# Patient Record
Sex: Female | Born: 1995
Health system: Southern US, Community
[De-identification: ages and names within clinical notes are randomized; demographics above are authoritative.]

## PROBLEM LIST (undated history)

## (undated) ENCOUNTER — Emergency Department (HOSPITAL_BASED_OUTPATIENT_CLINIC_OR_DEPARTMENT_OTHER): Admission: EM | Payer: Self-pay | Source: Home / Self Care

## (undated) DIAGNOSIS — O24419 Gestational diabetes mellitus in pregnancy, unspecified control: Secondary | ICD-10-CM

## (undated) DIAGNOSIS — B379 Candidiasis, unspecified: Secondary | ICD-10-CM

## (undated) DIAGNOSIS — R87629 Unspecified abnormal cytological findings in specimens from vagina: Secondary | ICD-10-CM

## (undated) DIAGNOSIS — B009 Herpesviral infection, unspecified: Secondary | ICD-10-CM

## (undated) HISTORY — PX: WISDOM TOOTH EXTRACTION: SHX21

## (undated) HISTORY — DX: Unspecified abnormal cytological findings in specimens from vagina: R87.629

## (undated) HISTORY — DX: Gestational diabetes mellitus in pregnancy, unspecified control: O24.419

---
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
## 2015-01-23 ENCOUNTER — Ambulatory Visit (INDEPENDENT_AMBULATORY_CARE_PROVIDER_SITE_OTHER): Payer: Medicaid Other | Admitting: Family Medicine

## 2015-01-23 VITALS — BP 116/93 | HR 86 | Temp 97.8°F | Wt 150.9 lb

## 2015-01-23 DIAGNOSIS — A630 Anogenital (venereal) warts: Secondary | ICD-10-CM | POA: Diagnosis not present

## 2015-01-23 DIAGNOSIS — Z23 Encounter for immunization: Secondary | ICD-10-CM | POA: Diagnosis not present

## 2015-01-23 DIAGNOSIS — J029 Acute pharyngitis, unspecified: Secondary | ICD-10-CM | POA: Diagnosis present

## 2015-01-23 NOTE — Assessment & Plan Note (Signed)
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  No vaginal discharge or dysuria  - advised that she may follow up to try cryotherapy.  - if doesn't tolerate cryotherapy then consider Podofilox but would need a urine preg prior to use.

## 2015-01-23 NOTE — Patient Instructions (Signed)
Thank you for coming in,   You can follow up with me or Dr. Leonides Schanzorsey and we can try cryotherapy to get rid of the wart.   Sign up for My Chart to have easy access to your labs results, and communication with your Primary care physician   Please feel free to call with any questions or concerns at any time, at [phone]. --Dr. Jordan LikesSchmitz  Genital Warts Genital warts are a common STD (sexually transmitted disease). They may appear as small bumps on the tissues of the genital area or anal area. Sometimes, they can become irritated and cause pain. Genital warts are easily passed to other people through sexual contact. Getting treatment is important because genital warts can lead to other problems. In females, the virus that causes genital warts may increase the risk of cervical cancer. CAUSES Genital warts are caused by a virus that is called human papillomavirus (HPV). HPV is spread by having unprotected sex with an infected person. It can be spread through vaginal, anal, and oral sex. Many people do not know that they are infected. They may be infected for years without problems. However, even if they do not have problems, they can pass the infection to their sexual partners. RISK FACTORS Genital warts are more likely to develop in:  People who have unprotected sex.  People who have multiple sexual partners.  People who become sexually active before they are 19 years of age.  Men who are not circumcised.  Women who have a female sexual partner who is not circumcised.  People who have a weakened body defense system (immune system) due to disease or medicine.  People who smoke. SYMPTOMS Symptoms of genital warts include:  Small growths in the genital area or anal area. These warts often grow in clusters.  Itching and irritation in the genital area or anal area.  Bleeding from the warts.  Painful sexual intercourse. DIAGNOSIS Genital warts can usually be diagnosed from their appearance on  the vagina, vulva, penis, perineum, anus, or rectum. Tests may also be done, such as:  Biopsy. A tissue sample is removed so it can be looked at under a microscope.  Colposcopy. In females, a magnifying tool is used to examine the vagina and cervix. Certain solutions may be used to make the HPV cells change color so they can be seen more easily.  A Pap test in females.  Tests for other STDs. TREATMENT Treatment for genital warts may include:  Applying prescription medicines to the warts. These may be solutions or creams.  Freezing the warts with liquid nitrogen (cryotherapy).  Burning the warts with:  Laser treatment.  An electrified probe (electrocautery).  Injecting a substance (Candida antigen or Trichophyton antigen) into the warts to help the body's immune system to fight off the warts.  Interferon injections.  Surgery to remove the warts. HOME CARE INSTRUCTIONS Medicines  Apply over-the-counter and prescription medicines only as told by your health care provider.  Do not treat genital warts with medicines that are used for treating hand warts.  Talk with your health care provider about using over-the-counter anti-itch creams. General Instructions  Do not touch or scratch the warts.  Do not have sex until your treatment has been completed.  Tell your current and past sexual partners about your condition because they may also need treatment.  Keep all follow-up visits as told by your health care provider. This is important.  After treatment, use condoms during sex to prevent future infections. Other Instructions for  Women  Women who have genital warts might need increased screening for cervical cancer. This type of cancer is slow growing and can be cured if it is found early. Chances of developing cervical cancer are increased with HPV.  If you become pregnant, tell your health care provider that you have had HPV. Your health care provider will monitor you  closely during pregnancy to be sure that your baby is safe. PREVENTION Talk with your health care provider about getting the HPV vaccines. These vaccines prevent some HPV infections and cancers. It is recommended that the vaccine be given to males and females who are 42-19 years of age. It will not work if you already have HPV, and it is not recommended for pregnant women. SEEK MEDICAL CARE IF:  You have redness, swelling, or pain in the area of the treated skin.  You have a fever.  You feel generally ill.  You feel lumps in and around your genital area or anal area.  You have bleeding in your genital area or anal area.  You have pain during sexual intercourse.   This information is not intended to replace advice given to you by your health care provider. Make sure you discuss any questions you have with your health care provider.   Document Released: 02/26/2000 Document Revised: 11/19/2014 Document Reviewed: 05/26/2014 Elsevier Interactive Patient Education [employer]

## 2015-01-23 NOTE — Progress Notes (Signed)
   Subjective:    Patient ID: Julie Michael, female    DOB: 13-Apr-1995, 19 y.o.   MRN: [id number]  Seen for Same day visit for   CC: RASH  Had rash for 14 days. Location: occurring on her right vulva  Medications tried: no Similar rash in past: no New medications or antibiotics: no Tick, Insect or new pet exposure: no Recent travel: [place]  New detergent or soap: no Immunocompromised: no Sexually active and last was Oct and condom  No vaginal discharge  No Dysuria   Symptoms Itching: no Pain over rash: no Feeling ill all over: no Fever: no Mouth sores: no Face or tongue swelling: no Trouble breathing: no Joint swelling or pain: no   Review of Systems   See HPI for ROS. Objective:  BP 116/93 mmHg  Pulse 86  Temp(Src) 97.8 F (36.6 C) (Oral)  Wt 150 lb 14.4 oz (68.448 kg)  LMP 12/27/2014  General: NAD Skin: rash GU: warty like lesions occuring in peroneum near entrance to vagina.       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  No vaginal discharge or dysuria  - advised that she may follow up to try cryotherapy.  - if doesn't tolerate cryotherapy then consider Podofilox but would need a urine preg prior to use.

## 2015-02-11 ENCOUNTER — Telehealth: Payer: Self-pay | Admitting: Family Medicine

## 2015-02-11 NOTE — Telephone Encounter (Signed)
Please contact the patient concerning her upcoming appointment and clarify if she is coming in for clearance for cryotherapy or actual cryotherapy.  Thank you, Julie Puffrystal S. Zurii Hewes, MD [facility] Resident  02/11/2015, 9:31 AM

## 2015-02-11 NOTE — Telephone Encounter (Signed)
Attempted to call patient on mobile without answer, home phone number did not go through. Please let the patient know that patients normally don't need clearance for cryotherapy (I'm not sure if the other office she's going to requires clearance but our office does not require this). Additionally please let her know that if her appointment is at the other office at 3pm, she will not be able to make it given her appt in our clinic is not until 2:30.  Thanks, Joanna Puffrystal S. Dorsey, MD [facility] Resident  02/11/2015, 1:35 PM

## 2015-02-11 NOTE — Telephone Encounter (Signed)
Patient is coming for clearance, has actual cryotherapy scheduled at another office at 3pm that day.

## 2015-02-18 ENCOUNTER — Ambulatory Visit: Payer: Medicaid Other | Admitting: Family Medicine

## 2015-03-04 ENCOUNTER — Ambulatory Visit: Payer: Medicaid Other | Admitting: Family Medicine

## 2015-07-12 IMAGING — CR DG CHEST 2V
2 series · 2 of 2 positions shown · non-contrast
Comparison: None.

CLINICAL DATA: Three days of cough.

EXAM:
CHEST  2 VIEW

[view not recorded (1 of 2)]
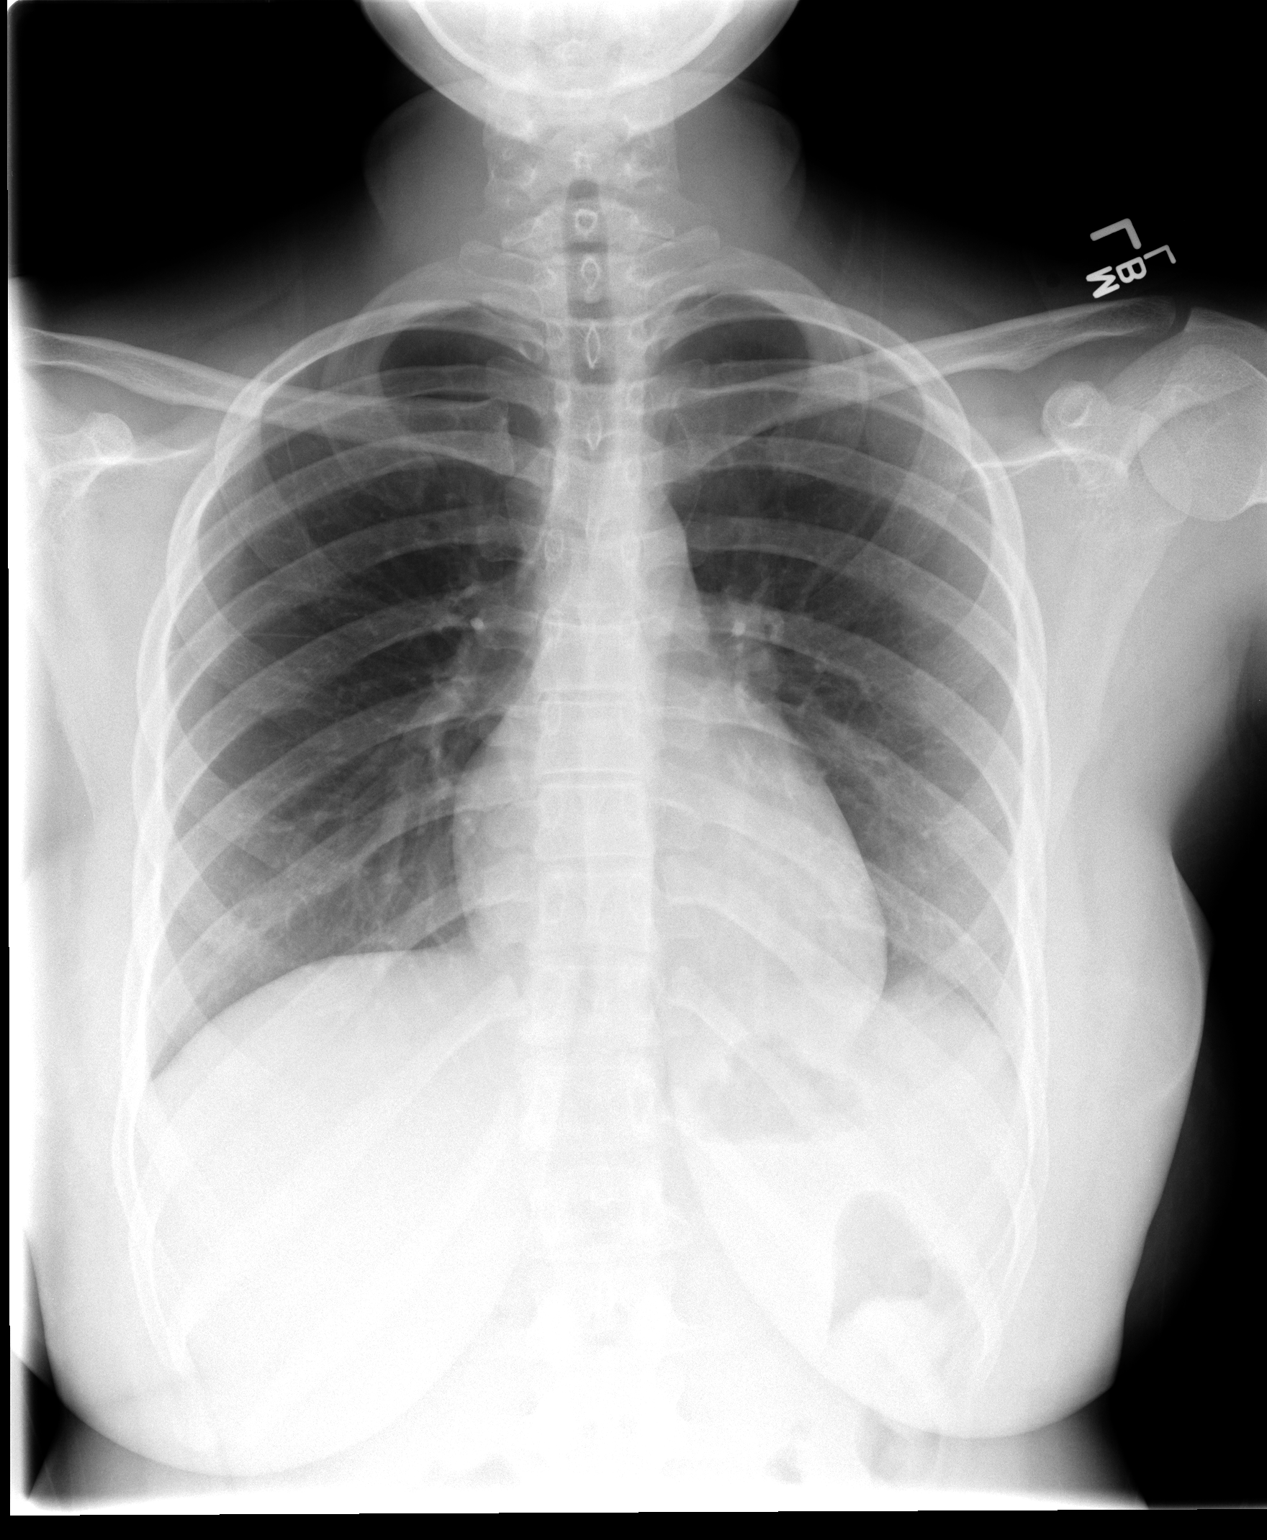

[view not recorded (2 of 2)]
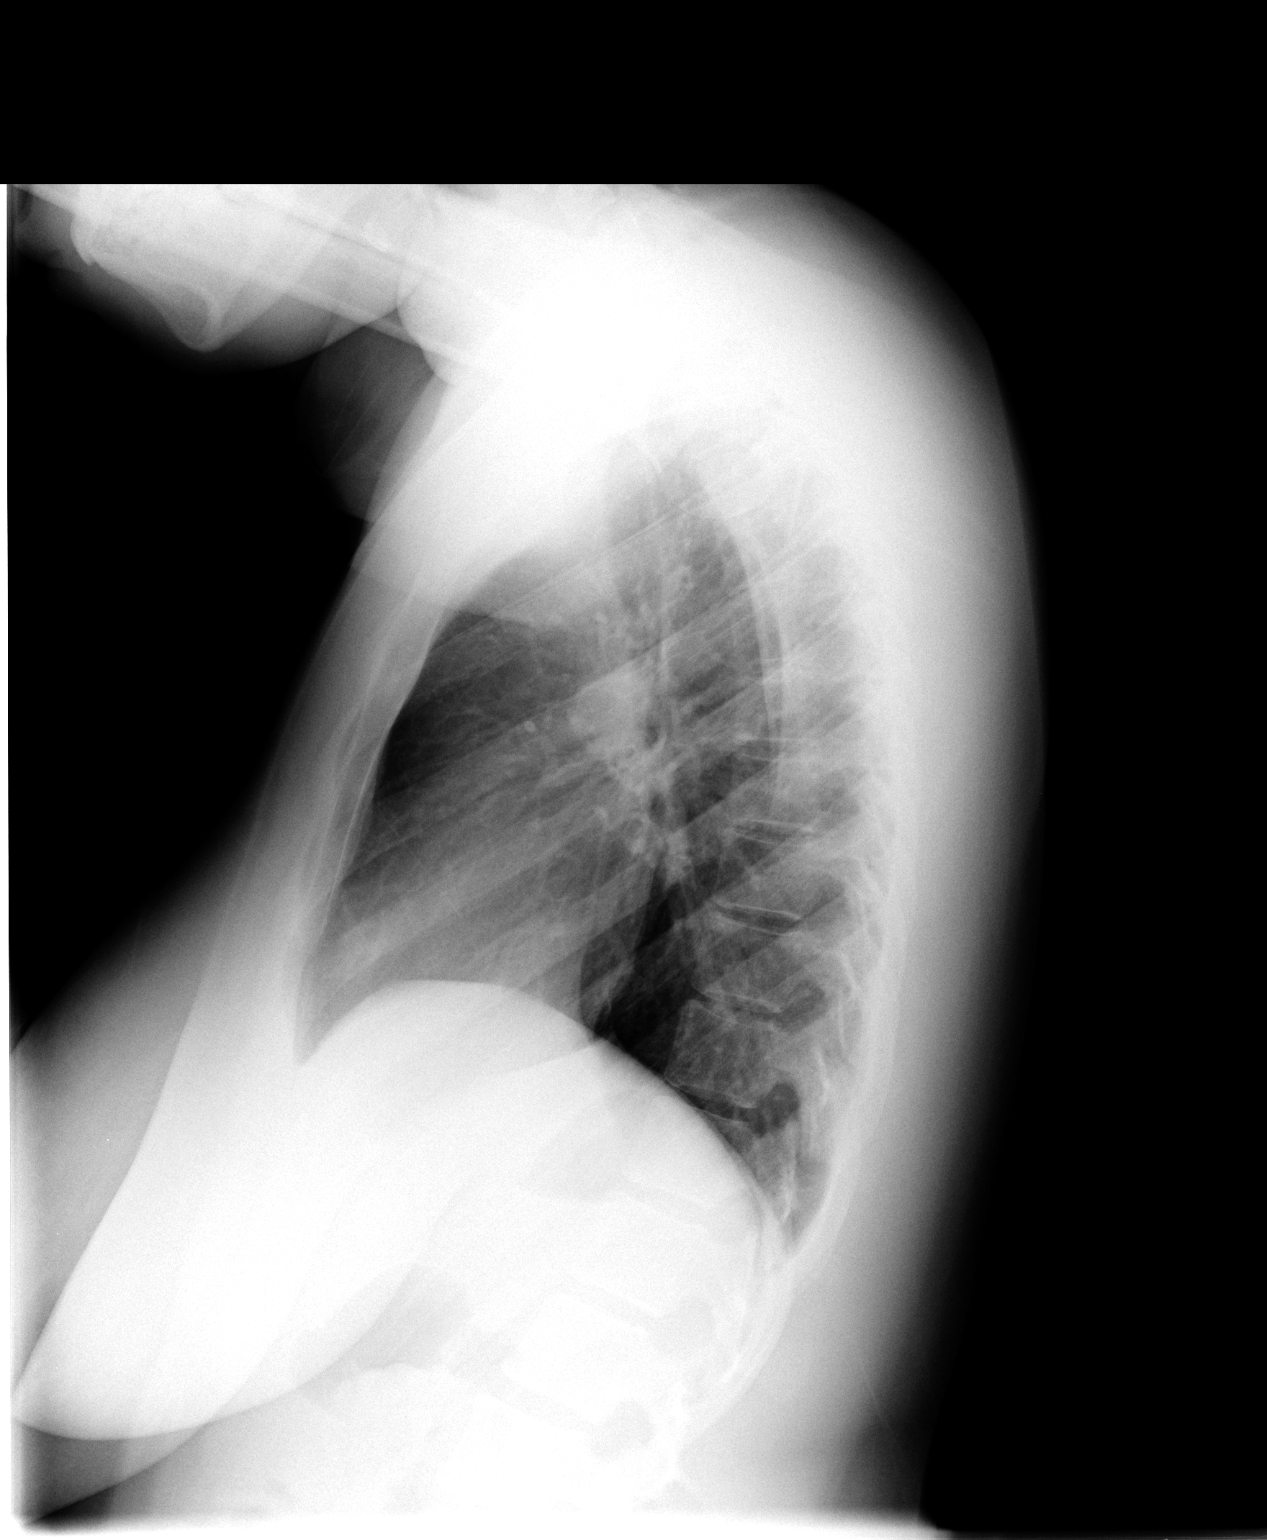

[2 of 2 positions shown; findings below may reference images not displayed]

FINDINGS: The lungs are well-expanded and clear. The cardiopericardial
silhouette is normal in size. The mediastinum is normal in width.
There is no pleural effusion or pneumothorax. The observed portions
of the bony thorax exhibit no acute abnormality.
IMPRESSION: There is no evidence of pneumonia nor other acute cardiopulmonary
abnormality.

## 2016-04-13 ENCOUNTER — Encounter: Payer: Self-pay | Admitting: Internal Medicine

## 2016-04-13 ENCOUNTER — Ambulatory Visit (INDEPENDENT_AMBULATORY_CARE_PROVIDER_SITE_OTHER): Payer: Self-pay | Admitting: Internal Medicine

## 2016-04-13 VITALS — BP 122/78 | HR 83 | Temp 98.9°F | Wt 162.0 lb

## 2016-04-13 DIAGNOSIS — N912 Amenorrhea, unspecified: Secondary | ICD-10-CM

## 2016-04-13 HISTORY — DX: Amenorrhea, unspecified: N91.2

## 2016-04-13 LAB — POCT URINE PREGNANCY: Preg Test, Ur: NEGATIVE

## 2016-04-13 NOTE — Progress Notes (Signed)
   Subjective:    Nance PearChristina Shedlock - 21 y.o. female MRN [id number]  Date of birth: 01/16/1996  HPI  Nance PearChristina Bambach is here for missed menstrual period.  Amenorrhea: First day of LMP 12/18. States she should have started her menstrual period on 1/15. She uses an app to track her period on her phone and her cycles are always regular. She does not use any type of contraception. Was previously on OCPs but discontinued them due to lack of sexual activity. Reports she is now sexually active again. Has been having unprotected sexual intercourse and states she has had intercourse on 12/18, 12/25, 1/4, 1/12 and most recently on 1/27. She does not wish to be pregnant at this time. Denies breast tenderness, nausea, and fatigue. States she feels fine. Took four urine pregnancy tests at home which were all negative.    -  reports that she has never smoked. She has never used smokeless tobacco. - Review of Systems: Per HPI. - Past Medical History: Patient Active Problem List   Diagnosis Date Noted  . Amenorrhea 04/13/2016  . Genital warts 01/23/2015  . Sinusitis, chronic 08/09/2013  . General counseling and advice on female contraception 11/29/2012  . VISUAL ACUITY, DECREASED, LEFT EYE 05/26/2009   - Medications: reviewed and updated   Objective:   Physical Exam BP 122/78   Pulse 83   Temp 98.9 F (37.2 C) (Oral)   Wt 162 lb (73.5 kg)   LMP 02/29/2016   BMI 31.64 kg/m  Gen: NAD, alert, cooperative with exam, well-appearing CV: RRR, good S1/S2, no murmur, no edema, capillary refill brisk  Resp: CTABL, no wheezes, non-labored  Assessment & Plan:   Amenorrhea Upreg in office negative. Will order quant bHCG today to rule out early pregnancy not detected by Upreg. Patient desires to start OCPs. She is to return in one week to initiate if bHCG negative. If patient has not started her menstrual period by next office visit would recommend repeat pregnancy testing. Condoms given today and  encouraged safe sex if wishing to avoid pregnancy.     Marcy Sirenatherine Exilda Wilhite, D.O. 04/13/2016, 9:11 AM PGY-2, [facility]

## 2016-04-13 NOTE — Assessment & Plan Note (Signed)
Upreg in office negative. Will order quant bHCG today to rule out early pregnancy not detected by Upreg. Patient desires to start OCPs. She is to return in one week to initiate if bHCG negative. If patient has not started her menstrual period by next office visit would recommend repeat pregnancy testing. Condoms given today and encouraged safe sex if wishing to avoid pregnancy.

## 2016-04-13 NOTE — Patient Instructions (Signed)
Your urine pregnancy test was negative. We will check a blood pregnancy test today as this can be more sensitive to an early pregnancy than urine.   I would recommend making a follow up appointment for later next week to start contraceptive measures.   Take Care,   Dr. Earlene PlaterWallace

## 2016-04-14 ENCOUNTER — Telehealth: Payer: Self-pay | Admitting: *Deleted

## 2016-04-14 LAB — HCG, QUANTITATIVE, PREGNANCY: hCG, Beta Chain, Quant, S: <2 m[IU]/mL

## 2016-04-14 NOTE — Telephone Encounter (Signed)
LVM for pt to call office back to inform her of below. Zimmerman Rumple, April D, CMA  

## 2016-04-14 NOTE — Telephone Encounter (Signed)
Pt was advised. Appointment made for 04-19-16 with Dr. Chanetta Marshallimberlake. ep

## 2016-04-14 NOTE — Telephone Encounter (Signed)
-----   Message from Arvilla Marketatherine Lauren Wallace, DO sent at 04/14/2016 10:58 AM EST ----- Please call patient and let her know blood pregnancy test was negative. She should follow up asap for contraception management.   Marcy Sirenatherine Wallace, D.O. 04/14/2016, 10:58 AM PGY-2, [facility]

## 2016-04-19 ENCOUNTER — Ambulatory Visit: Payer: Medicaid Other | Admitting: Family Medicine

## 2016-04-26 ENCOUNTER — Ambulatory Visit: Payer: Medicaid Other | Admitting: Family Medicine

## 2016-04-26 NOTE — Progress Notes (Deleted)
   Subjective:   Patient ID: Julie Michael    DOB: [birth year], 21 y.o. female   MRN: [id number]  CC: ***  HPI: Julie Michael is a 21 y.o. female who presents to clinic today ***. Problems discussed today are as follows:   Seen in office on 1/31, Upreg negative.  Quant bHCG also negative.  Patient desires to start OCPs.  Advised to repeat preg testing if has not had her period by next office visit.   Patient did not come 2/6 appointment.   Menstrual period since last time?   Currently uses condoms for contraception?  Tried norgestimate-ethinyl estradiol (Ortho-cyclen, Sprintec, Previfem) in the past ?2014.  How did you like it? How long were you on it?  Reason for stopping it.    Side effects Tolerating it well Plan to get pregnant any time soon? Periods Spotting in between? Refill or wish to change form of contraception:   ROS: See HPI for pertinent ROS.  PMFSH: Pertinent past medical, surgical, family, and social history were reviewed and updated as appropriate. Smoking status reviewed.  Medications reviewed.  Objective:   There were no vitals taken for this visit. Vitals and nursing note reviewed.  General: well nourished, well developed, in no acute distress with non-toxic appearance HEENT: normocephalic, atraumatic, moist mucous membranes Neck: supple, non-tender without lymphadenopathy CV: regular rate and rhythm without murmurs rubs or gallops Lungs: clear to auscultation bilaterally with normal work of breathing Abdomen: soft, non-tender, no masses or organomegaly palpable, normoactive bowel sounds Skin: warm, dry, no rashes or lesions, cap refill < 2 seconds Extremities: warm and well perfused, normal tone  Assessment & Plan:   No problem-specific Assessment & Plan notes found for this encounter.  No orders of the defined types were placed in this encounter.  No orders of the defined types were placed in this encounter.   Freddrick MarchYashika Carman Auxier, MD [facility], PGY-1 04/26/2016 8:39 AM

## 2016-09-08 ENCOUNTER — Other Ambulatory Visit (HOSPITAL_COMMUNITY)
Admission: RE | Admit: 2016-09-08 | Discharge: 2016-09-08 | Disposition: A | Discharge status: Home / Self Care | Payer: Medicaid Other | Source: Ambulatory Visit | Attending: Family Medicine | Admitting: Family Medicine

## 2016-09-08 ENCOUNTER — Encounter: Payer: Self-pay | Admitting: Student

## 2016-09-08 ENCOUNTER — Ambulatory Visit (INDEPENDENT_AMBULATORY_CARE_PROVIDER_SITE_OTHER): Payer: Self-pay | Admitting: Student

## 2016-09-08 VITALS — BP 120/62 | HR 93 | Temp 98.3°F | Wt 154.0 lb

## 2016-09-08 DIAGNOSIS — Z124 Encounter for screening for malignant neoplasm of cervix: Secondary | ICD-10-CM | POA: Insufficient documentation

## 2016-09-08 DIAGNOSIS — Z113 Encounter for screening for infections with a predominantly sexual mode of transmission: Secondary | ICD-10-CM | POA: Insufficient documentation

## 2016-09-08 DIAGNOSIS — Z0000 Encounter for general adult medical examination without abnormal findings: Secondary | ICD-10-CM

## 2016-09-08 DIAGNOSIS — Z30019 Encounter for initial prescription of contraceptives, unspecified: Secondary | ICD-10-CM

## 2016-09-08 DIAGNOSIS — IMO0001 Reserved for inherently not codable concepts without codable children: Secondary | ICD-10-CM | POA: Insufficient documentation

## 2016-09-08 DIAGNOSIS — B379 Candidiasis, unspecified: Secondary | ICD-10-CM | POA: Insufficient documentation

## 2016-09-08 DIAGNOSIS — N898 Other specified noninflammatory disorders of vagina: Secondary | ICD-10-CM

## 2016-09-08 HISTORY — DX: Encounter for screening for infections with a predominantly sexual mode of transmission: Z11.3

## 2016-09-08 LAB — POCT WET PREP (WET MOUNT)
CLUE CELLS WET PREP WHIFF POC: NEGATIVE
Trichomonas Wet Prep HPF POC: ABSENT

## 2016-09-08 LAB — POCT URINE PREGNANCY: Preg Test, Ur: NEGATIVE

## 2016-09-08 MED ORDER — FLUCONAZOLE 150 MG PO TABS: 150.0000 mg | ORAL_TABLET | Freq: Once | ORAL | 0 refills | Status: AC

## 2016-09-08 MED ORDER — NORGESTIMATE-ETH ESTRADIOL 0.25-35 MG-MCG PO TABS: 1.0000 | ORAL_TABLET | Freq: Every day | ORAL | 11 refills | Status: DC

## 2016-09-08 NOTE — Assessment & Plan Note (Signed)
>>  ASSESSMENT AND PLAN FOR DISCHARGE FROM THE VAGINA WRITTEN ON 09/08/2016  2:33 PM BY HANEY, ALYSSA A, MD  Vaginal discharge with otherwise benign exam - wet prep shows yeast, will start diflucan - GC/Ct collected today

## 2016-09-08 NOTE — Assessment & Plan Note (Signed)
Pap smear today. 

## 2016-09-08 NOTE — Assessment & Plan Note (Signed)
Contraception counseling given - Upreg negative, will start OCPs now - she is considering LARC methods, information hand out given

## 2016-09-08 NOTE — Patient Instructions (Addendum)
Follow up with PCP in one month for contraception follow up You will be called for any abnormal results Call the office with questions or concerns

## 2016-09-08 NOTE — Assessment & Plan Note (Addendum)
Vaginal discharge with otherwise benign exam - wet prep shows yeast, will start diflucan - GC/Ct collected today

## 2016-09-08 NOTE — Progress Notes (Signed)
   Subjective:    Patient ID: Julie Michael, female    DOB: 10/13/1995, 21 y.o.   MRN: [id number]   CC: vaginal discharge  HPI: 21 y/o F presents for vaginal discharge  Vaginal discharge - noted 2 days ago - some itching when she wipes and some irritation - no odor, abdominal pain or dysuria - she is sexually active with one female partner - she does not use condoms or contraception - Patient's last menstrual period was 08/17/2016. - denies history of STD or pregnancy  Smoking status reviewed . Review of Systems  Per HPI, else denies recent illness, fever,  chest pain, shortness of breath,     Objective:  BP 120/62   Pulse 93   Temp 98.3 F (36.8 C) (Oral)   Wt 154 lb (69.9 kg)   LMP 08/17/2016   SpO2 98%   BMI 30.08 kg/m  Vitals and nursing note reviewed  General: NAD Cardiac: RRR,  Respiratory: CTAB, normal effort Skin: warm and dry, no rashes noted Neuro: alert and oriented, no focal deficits Pelvic: Normal EGBUS, normal vaginal canal but with copious white thick clumpy discharge, cervix mildly friable but else normal with no CMT, normal mobile uterus, normal adnexa with no masses, no adnexal tenderness     Assessment & Plan:    Contraception Contraception counseling given - Upreg negative, will start OCPs now - she is considering LARC methods, information hand out given  Vaginal discharge Vaginal discharge with otherwise benign exam - wet prep shows yeast, will start diflucan - GC/Ct collected today  Healthcare maintenance Pap smear today    Charrisse Masley A. Kennon RoundsHaney MD, MS Family Medicine Resident PGY-3 Pager [phone]

## 2016-09-09 LAB — CERVICOVAGINAL ANCILLARY ONLY
Chlamydia: NEGATIVE
NEISSERIA GONORRHEA: NEGATIVE

## 2016-09-12 LAB — CYTOLOGY - PAP: DIAGNOSIS: NEGATIVE

## 2016-10-17 ENCOUNTER — Other Ambulatory Visit (HOSPITAL_COMMUNITY)
Admission: RE | Admit: 2016-10-17 | Discharge: 2016-10-17 | Disposition: A | Discharge status: Home / Self Care | Payer: BLUE CROSS/BLUE SHIELD | Source: Ambulatory Visit | Attending: Family Medicine | Admitting: Family Medicine

## 2016-10-17 ENCOUNTER — Ambulatory Visit (INDEPENDENT_AMBULATORY_CARE_PROVIDER_SITE_OTHER): Payer: BLUE CROSS/BLUE SHIELD | Admitting: Internal Medicine

## 2016-10-17 ENCOUNTER — Telehealth: Payer: Self-pay | Admitting: *Deleted

## 2016-10-17 ENCOUNTER — Encounter: Payer: Self-pay | Admitting: Internal Medicine

## 2016-10-17 VITALS — BP 100/70 | HR 75 | Temp 99.0°F | Wt 156.0 lb

## 2016-10-17 DIAGNOSIS — Z7251 High risk heterosexual behavior: Secondary | ICD-10-CM | POA: Diagnosis not present

## 2016-10-17 DIAGNOSIS — N898 Other specified noninflammatory disorders of vagina: Secondary | ICD-10-CM

## 2016-10-17 LAB — POCT WET PREP (WET MOUNT)
CLUE CELLS WET PREP WHIFF POC: NEGATIVE
Trichomonas Wet Prep HPF POC: ABSENT

## 2016-10-17 LAB — POCT URINE PREGNANCY: Preg Test, Ur: NEGATIVE

## 2016-10-17 NOTE — Progress Notes (Signed)
   [facility] Phone: [phone]  Subjective:  Julie Michael is a 21 year old female presenting to clinic with vaginal discharge for the last 2 weeks. The discharge has been clear and white. She thought she had a yeast infection, so she bought some over-the-counter Monistat which she used on Thursday. No vaginal itching or irritation. She has had some vaginal bleeding starting yesterday. She has not started her period until tomorrow. She states unusual for her to start her period a few days early. She notes some mild abdominal cramping, which is typical for her when she has been shaving. She has a new sexual partner since June. She got a prescription for OCPs recently, but has not started taking them yet. She used a condom with her new sexual partner.  ROS: See HPI for pertinent positives and negatives  Past Medical History- chronic sinusitis  Family history reviewed for today's visit. No changes.  Social history- patient is a never smoker  Objective: BP 100/70   Pulse 75   Temp 99 F (37.2 C) (Oral)   Wt 156 lb (70.8 kg)   LMP 09/18/2016   SpO2 98%   BMI 30.47 kg/m  Gen: NAD, alert, cooperative with exam GI: Soft, nontender, nondistended GU: External genitalia normal in appearance, no vaginal lesions, vaginal walls normal, cervix normal in appearance, no cervical motion tenderness, small amount of white discharge present with minimal amount of brown blood.  Assessment/Plan: Vaginal Discharge: Think this is unlikely BV or yeast infection, given her lack of itchiness or irritation. Patient is only having a small amount of white discharge, likely physiologic. New sexual partner, so will need to rule out sexually transmitted diseases. - Wet prep - HIV, RPR, gonorrhea/chlamydia - Urine pregnancy test - Will be starting OCPs this coming Sunday, already has the prescription at home - Will call patient with lab results.   Willadean CarolKaty Sharlize Hoar, MD PGY-3

## 2016-10-17 NOTE — Telephone Encounter (Signed)
-----   Message from Campbell StallKaty Dodd Mayo, MD sent at 10/17/2016  4:00 PM EDT ----- Please let [name] know that she does not have yeast or bacterial vaginosis. Her pregnancy test was negative. We will call her when her STD testing comes back in the next few days. Thanks!

## 2016-10-17 NOTE — Patient Instructions (Signed)
It was so nice to meet you!  I will call you later today with your lab results.  -Dr. Nancy MarusMayo

## 2016-10-17 NOTE — Telephone Encounter (Signed)
Spoke with patient and she is aware of results. Jibran Crookshanks,CMA  

## 2016-10-17 NOTE — Assessment & Plan Note (Signed)
Think this is unlikely BV or yeast infection, given her lack of itchiness or irritation. Patient is only having a small amount of white discharge, likely physiologic. New sexual partner, so will need to rule out sexually transmitted diseases. - Wet prep - HIV, RPR, gonorrhea/chlamydia - Urine pregnancy test - Will be starting OCPs this coming Sunday, already has the prescription at home - Will call patient with lab results.

## 2016-10-17 NOTE — Assessment & Plan Note (Signed)
>>  ASSESSMENT AND PLAN FOR DISCHARGE FROM THE VAGINA WRITTEN ON 10/17/2016 12:16 PM BY MAYO, KATY DODD, MD  Think this is unlikely BV or yeast infection, given her lack of itchiness or irritation. Patient is only having a small amount of white discharge, likely physiologic. New sexual partner, so will need to rule out sexually transmitted diseases. - Wet prep - HIV, RPR, gonorrhea/chlamydia - Urine pregnancy test - Will be starting OCPs this coming Sunday, already has the prescription at home - Will call patient with lab results.

## 2016-10-18 LAB — CERVICOVAGINAL ANCILLARY ONLY
CHLAMYDIA, DNA PROBE: NEGATIVE
NEISSERIA GONORRHEA: NEGATIVE

## 2016-10-19 ENCOUNTER — Encounter: Payer: Self-pay | Admitting: Student

## 2016-10-25 ENCOUNTER — Telehealth: Payer: Self-pay | Admitting: *Deleted

## 2016-10-25 NOTE — Telephone Encounter (Signed)
Patient is aware of negative STD results. Jazmin Hartsell,CMA

## 2016-10-25 NOTE — Progress Notes (Signed)
LM for patient to call the office back. Jazmin Hartsell,CMA

## 2016-11-30 ENCOUNTER — Other Ambulatory Visit (HOSPITAL_COMMUNITY)
Admission: RE | Admit: 2016-11-30 | Discharge: 2016-11-30 | Disposition: A | Discharge status: Home / Self Care | Payer: Medicaid Other | Source: Ambulatory Visit | Attending: Family Medicine | Admitting: Family Medicine

## 2016-11-30 ENCOUNTER — Ambulatory Visit (INDEPENDENT_AMBULATORY_CARE_PROVIDER_SITE_OTHER): Payer: Medicaid Other | Admitting: Student in an Organized Health Care Education/Training Program

## 2016-11-30 VITALS — BP 110/70 | HR 73 | Temp 98.6°F | Ht 60.0 in | Wt 156.8 lb

## 2016-11-30 DIAGNOSIS — N898 Other specified noninflammatory disorders of vagina: Secondary | ICD-10-CM | POA: Insufficient documentation

## 2016-11-30 DIAGNOSIS — Z113 Encounter for screening for infections with a predominantly sexual mode of transmission: Secondary | ICD-10-CM

## 2016-11-30 LAB — POCT WET PREP (WET MOUNT)
CLUE CELLS WET PREP WHIFF POC: NEGATIVE
Trichomonas Wet Prep HPF POC: ABSENT

## 2016-11-30 MED ORDER — MICONAZOLE NITRATE 2 % VA CREA: 1.0000 | TOPICAL_CREAM | Freq: Every day | VAGINAL | 0 refills | Status: DC

## 2016-11-30 NOTE — Patient Instructions (Signed)
It was a pleasure seeing you today in our clinic. Today we discussed your vaginal discharge. Here is the treatment plan we have discussed and agreed upon together: - We drew blood work at today's visit. I will call or send you a letter with these results. If you do not hear from me within the next week, please give our office a call. - I will call you with the results of your pregnancy test - in the meantime, the 7-day course of vaginal treatment for yeast infection is called into your pharmacy.  Our clinic's number is [phone]. Please call with questions or concerns about what we discussed today.  Be well, Dr. Mosetta Putt

## 2016-11-30 NOTE — Progress Notes (Signed)
   CC: vaginal itching  HPI: Julie Michael is a 21 y.o. female with PMH significant for STI who presents to [facility] today with vaginal itching of 3 days duration.   VAGINAL DISCHARGE  Onset: 3 days ago   Description: not clumpy, clear/white  Odor: no odor   Itching: yes  Symptoms Dysuria: no  Bleeding: no  Pelvic pain: no  Back pain: no  Fever: no  Genital sores: no  Rash: no  Dyspareunia: no  GI Sxs: no  Prior treatment: yes, reports yeast infection 2-3 months ago   Red Flags: Missed period: no; last period 9/5 Pregnancy: no  Recent antibiotics: no  Sexual activity: no  Possible STD exposure: yes  IUD: no  Diabetes: no   Has not started pill yet.    Review of Symptoms:  See HPI for ROS.   CC, SH/smoking status, and VS noted.  Objective: BP 110/70 (BP Location: Left Arm, Patient Position: Sitting, Cuff Size: Normal)   Pulse 73   Temp 98.6 F (37 C) (Oral)   Ht 5' (1.524 m)   Wt 156 lb 12.8 oz (71.1 kg)   LMP 11/16/2016   SpO2 99%   BMI 30.62 kg/m  GEN: NAD, alert, cooperative, and pleasant. Female genitalia: not done normal external genitalia, vulva, vagina, cervix, uterus and adnexa Cervix: cervical discharge present - creamy and curd-like   Assessment and plan:  Routine screening for STI (sexually transmitted infection) GC/Chlamydia, HIV, wet prep completed today.  Vaginal discharge "Few yeast" on wet prep, but discharge looks like candida and patient endorses her symptoms are the same as previous yeast infections. Patient is 2 weeks into her cycle and has had unprotected intercourse, so cannot give diflucan without testing for pregnancy and it is too early for a urine pregnancy test to be reliable.  - bHcg quant - if bHcg negative, can give diflucan - patient doesn't want to wait for bHcg results tomorrow to start treatment so she'll start monostat 7 day course this evening. Can stop and do diflucan once pregnancy test is negative.   Orders  Placed This Encounter  Procedures  . HIV antibody (with reflex)  . hCG, serum, qualitative  . POCT Wet Prep [facility])    Meds ordered this encounter  Medications  . miconazole (EQ MICONAZOLE 7 DAY TREATMENT) 2 % vaginal cream    Sig: Place 1 Applicatorful vaginally at bedtime.    Dispense:  45 g    Refill:  0     Howard Pouch, MD,MS,  PGY2 12/01/2016 3:54 PM

## 2016-12-01 ENCOUNTER — Other Ambulatory Visit: Payer: Self-pay | Admitting: Student in an Organized Health Care Education/Training Program

## 2016-12-01 LAB — CERVICOVAGINAL ANCILLARY ONLY
CHLAMYDIA, DNA PROBE: NEGATIVE
NEISSERIA GONORRHEA: NEGATIVE

## 2016-12-01 LAB — HCG, SERUM, QUALITATIVE: hCG,Beta Subunit,Qual,Serum: NEGATIVE m[IU]/mL (ref <6)

## 2016-12-01 LAB — HIV ANTIBODY (ROUTINE TESTING W REFLEX): HIV SCREEN 4TH GENERATION: NONREACTIVE

## 2016-12-01 MED ORDER — FLUCONAZOLE 150 MG PO TABS: 150.0000 mg | ORAL_TABLET | Freq: Every day | ORAL | 0 refills | Status: AC

## 2016-12-01 NOTE — Progress Notes (Signed)
Pregnancy test negative. Called patient to give her these results and she asks for PO management of yeast infection. Diflucan 150 mg x1 sent to pharmacy.

## 2016-12-01 NOTE — Assessment & Plan Note (Signed)
GC/Chlamydia, HIV, wet prep completed today.

## 2016-12-01 NOTE — Assessment & Plan Note (Signed)
"  Few yeast" on wet prep, but discharge looks like candida and patient endorses her symptoms are the same as previous yeast infections. Patient is 2 weeks into her cycle and has had unprotected intercourse, so cannot give diflucan without testing for pregnancy and it is too early for a urine pregnancy test to be reliable.  - bHcg quant - if bHcg negative, can give diflucan - patient doesn't want to wait for bHcg results tomorrow to start treatment so she'll start monostat 7 day course this evening. Can stop and do diflucan once pregnancy test is negative.

## 2016-12-01 NOTE — Assessment & Plan Note (Signed)
>>  ASSESSMENT AND PLAN FOR DISCHARGE FROM THE VAGINA WRITTEN ON 12/01/2016  3:54 PM BY Howard Pouch, MD  "Few yeast" on wet prep, but discharge looks like candida and patient endorses her symptoms are the same as previous yeast infections. Patient is 2 weeks into her cycle and has had unprotected intercourse, so cannot give diflucan without testing for pregnancy and it is too early for a urine pregnancy test to be reliable.  - bHcg quant - if bHcg negative, can give diflucan - patient doesn't want to wait for bHcg results tomorrow to start treatment so she'll start monostat 7 day course this evening. Can stop and do diflucan once pregnancy test is negative.

## 2016-12-05 ENCOUNTER — Encounter: Payer: Self-pay | Admitting: Student in an Organized Health Care Education/Training Program

## 2016-12-16 ENCOUNTER — Ambulatory Visit (INDEPENDENT_AMBULATORY_CARE_PROVIDER_SITE_OTHER): Payer: Self-pay | Admitting: Family Medicine

## 2016-12-16 ENCOUNTER — Encounter: Payer: Self-pay | Admitting: Family Medicine

## 2016-12-16 VITALS — BP 100/60 | HR 80 | Temp 98.3°F | Ht 60.0 in | Wt 155.0 lb

## 2016-12-16 DIAGNOSIS — M7989 Other specified soft tissue disorders: Secondary | ICD-10-CM

## 2016-12-16 DIAGNOSIS — M79674 Pain in right toe(s): Secondary | ICD-10-CM

## 2016-12-16 MED ORDER — NAPROXEN 500 MG PO TABS: 500.0000 mg | ORAL_TABLET | Freq: Two times a day (BID) | ORAL | 0 refills | Status: AC

## 2016-12-16 NOTE — Patient Instructions (Signed)
It was nice seeing you today. You might be having pain from stress fracture vs overuse. Please obtain foot brace from the pharmacy to keep weight off it. Use Naproxen as needed for pain.

## 2016-12-16 NOTE — Progress Notes (Signed)
Subjective:     Patient ID: Julie Michael, female   DOB: 1995-07-28, 21 y.o.   MRN: [id number]  HPI Foot pain: C/O right foot pain x 1 week. Denies any injury but feels swollen and lax whenever she works on it. At work she had to stand on her feet 6-8 hours 6 days per week.She had been with her job for 3 months and pain started few weeks after she started work. Pain is about 7/10 in severity. Yesterday her foot was so swollen and the pain was about 9/10 in severity. She took ibuprofen as needed for pain with some improvement. Today the swollen is down.  Current Outpatient Prescriptions on File Prior to Visit  Medication Sig Dispense Refill  . ibuprofen (ADVIL,MOTRIN) 800 MG tablet Take 1 tablet (800 mg total) by mouth 3 (three) times daily. (Patient not taking: Reported on 09/16/2014) 21 tablet 0  . metroNIDAZOLE (FLAGYL) 500 MG tablet Take 1 tablet (500 mg total) by mouth 2 (two) times daily. (Patient not taking: Reported on 12/16/2016) 14 tablet 0  . miconazole (EQ MICONAZOLE 7 DAY TREATMENT) 2 % vaginal cream Place 1 Applicatorful vaginally at bedtime. (Patient not taking: Reported on 12/16/2016) 45 g 0  . norgestimate-ethinyl estradiol (ORTHO-CYCLEN,SPRINTEC,PREVIFEM) 0.25-35 MG-MCG tablet Take 1 tablet by mouth daily. (Patient not taking: Reported on 12/16/2016) 1 Package 11  . Pseudoeph-Doxylamine-DM-APAP (NYQUIL MULTI-SYMPTOM PO) Take 30 mLs by mouth daily as needed (for cold).    . SALINE NASAL MIST NA Place 2 sprays into the nose daily as needed (for congestion).    . sucralfate (CARAFATE) 1 GM/10ML suspension Take 5 mLs (0.5 g total) by mouth 3 (three) times daily as needed (mouth pain). (Patient not taking: Reported on 09/16/2014) 420 mL 0   Current Facility-Administered Medications on File Prior to Visit  Medication Dose Route Frequency Provider Last Rate Last Dose  . meningococcal polysaccharide (MENACTRA) injection 0.5 mL  0.5 mL Intramuscular Once Rodolph Bong, MD       History  reviewed. No pertinent past medical history. Vitals:   12/16/16 0922  BP: 100/60  Pulse: 80  Temp: 98.3 F (36.8 C)  TempSrc: Oral  SpO2: 99%  Weight: 155 lb (70.3 kg)  Height: 5' (1.524 m)     Review of Systems  Respiratory: Negative.   Cardiovascular: Negative.   Musculoskeletal:       Right foot pain and swelling.  All other systems reviewed and are negative.      Objective:   Physical Exam  Constitutional: She appears well-developed. No distress.  Cardiovascular: Normal rate, regular rhythm and normal heart sounds.   No murmur heard. Pulmonary/Chest: Effort normal and breath sounds normal. No respiratory distress. She has no wheezes.  Musculoskeletal:       Right ankle: She exhibits normal range of motion and no swelling.       Left ankle: Normal.       Right foot: There is bony tenderness. There is normal range of motion, no swelling, normal capillary refill, no deformity and no laceration.       Left foot: Normal.       Feet:  Nursing note and vitals reviewed.      Assessment:     Right foot pain and swelling    Plan:     Likely due to overuse vs stress fracture. I recommended intermittent elevation and reduced weight bearing on her right foot. I recommended referral to sport meds today to apply foot boot but she  stated she will get it OTC. Ace bandage applied. Naproxen prescribed prn pain. F/U soon if no improvement or symptoms worsens. She agreed with plan.

## 2017-07-11 NOTE — Progress Notes (Signed)
   [facility] Phone: [phone]   Date of Visit: 07/12/2017   HPI:  Vaginal Irritation:  - 2 week history of burning with wiping. Resolved but then reappeared this past Sunday  - she is having some thin white discharge but unsure if this is her physiologic discharge - no dysuria, no urinary frequnecy, hematuria, no CVA pain, no fevers, no abdominal pain  - sexually active but does not use condoms. Discussed - currently not on birth control. Hesitant about some of the options. She was on OCPs but does not remember to take them daily   - usually has irregular periods. LMP April 18th which was her usual period.    ROS: See HPI.  PMFSH:  PMH: Genital Warts  PHYSICAL EXAM: BP 102/60   Pulse 74   Temp 98.6 F (37 C) (Oral)   Ht 5' (1.524 m)   Wt 164 lb (74.4 kg)   LMP 06/29/2017   BMI 32.03 kg/m  GEN: NAD CV: RRR, no murmurs, rubs, or gallops PULM: CTAB, normal effort ABD: Soft, nontender, nondistended, NABS, no organomegaly Female genitalia: normal external genitalia, vulva, vagina, cervix, uterus and adnexa. White thick discharge noted. Mildly erythematous skin on the external genitalia consistent with yeast infection.    SKIN: No rash or cyanosis; warm and well-perfused PSYCH: Mood and affect euthymic, normal rate and volume of speech NEURO: Awake, alert, no focal deficits grossly, normal speech  ASSESSMENT/PLAN:  Bacterial vaginosis: Noted on wet prep.  Metronidazole 500 mg twice daily for 7 days prescribed.  Counseled on avoiding alcohol use during in a few days after taking medication.  Vaginal yeast infection: Diflucan 150 mg p.o. x1.  Can repeat if symptoms persistent after 72 hours with another dose.  We will also do STI screening with HIV, RPR, gonorrhea and chlamydia Provided information on contraceptive options  Palma Holter, MD PGY 3 [facility]

## 2017-07-12 ENCOUNTER — Telehealth: Payer: Self-pay | Admitting: Internal Medicine

## 2017-07-12 ENCOUNTER — Telehealth: Payer: Self-pay | Admitting: Family Medicine

## 2017-07-12 ENCOUNTER — Other Ambulatory Visit (HOSPITAL_COMMUNITY)
Admission: RE | Admit: 2017-07-12 | Discharge: 2017-07-12 | Disposition: A | Discharge status: Home / Self Care | Payer: Self-pay | Source: Ambulatory Visit | Attending: Family Medicine | Admitting: Family Medicine

## 2017-07-12 ENCOUNTER — Ambulatory Visit (INDEPENDENT_AMBULATORY_CARE_PROVIDER_SITE_OTHER): Payer: Self-pay | Admitting: Internal Medicine

## 2017-07-12 ENCOUNTER — Encounter: Payer: Self-pay | Admitting: Internal Medicine

## 2017-07-12 ENCOUNTER — Other Ambulatory Visit: Payer: Self-pay

## 2017-07-12 VITALS — BP 102/60 | HR 74 | Temp 98.6°F | Ht 60.0 in | Wt 164.0 lb

## 2017-07-12 DIAGNOSIS — Z113 Encounter for screening for infections with a predominantly sexual mode of transmission: Secondary | ICD-10-CM

## 2017-07-12 DIAGNOSIS — B3731 Acute candidiasis of vulva and vagina: Secondary | ICD-10-CM

## 2017-07-12 DIAGNOSIS — B373 Candidiasis of vulva and vagina: Secondary | ICD-10-CM

## 2017-07-12 DIAGNOSIS — R8290 Unspecified abnormal findings in urine: Secondary | ICD-10-CM

## 2017-07-12 DIAGNOSIS — B9689 Other specified bacterial agents as the cause of diseases classified elsewhere: Secondary | ICD-10-CM

## 2017-07-12 DIAGNOSIS — N760 Acute vaginitis: Secondary | ICD-10-CM

## 2017-07-12 LAB — POCT URINALYSIS DIP (MANUAL ENTRY)
BILIRUBIN UA: NEGATIVE mg/dL
Bilirubin, UA: NEGATIVE
Glucose, UA: NEGATIVE mg/dL
Leukocytes, UA: NEGATIVE
Nitrite, UA: NEGATIVE
PH UA: 6.5 (ref 5.0–8.0)
PROTEIN UA: NEGATIVE mg/dL
SPEC GRAV UA: 1.02 (ref 1.010–1.025)
Urobilinogen, UA: 0.2 E.U./dL

## 2017-07-12 LAB — POCT WET PREP (WET MOUNT)
Clue Cells Wet Prep Whiff POC: POSITIVE
TRICHOMONAS WET PREP HPF POC: ABSENT

## 2017-07-12 LAB — POCT UA - MICROSCOPIC ONLY

## 2017-07-12 MED ORDER — FLUCONAZOLE 150 MG PO TABS: 150.0000 mg | ORAL_TABLET | Freq: Once | ORAL | 0 refills | Status: AC

## 2017-07-12 MED ORDER — METRONIDAZOLE 500 MG PO TABS: 500.0000 mg | ORAL_TABLET | Freq: Two times a day (BID) | ORAL | 0 refills | Status: AC

## 2017-07-12 MED ORDER — METRONIDAZOLE 500 MG PO TABS: 500.0000 mg | ORAL_TABLET | Freq: Two times a day (BID) | ORAL | 0 refills | Status: DC

## 2017-07-12 NOTE — Patient Instructions (Addendum)
Please take Flagyl 1 tab twice a day for 7 days for bacterial vaginosis. I will call you about the other results.   Bacterial Vaginosis Bacterial vaginosis is an infection of the vagina. It happens when too many germs (bacteria) grow in the vagina. This infection puts you at risk for infections from sex (STIs). Treating this infection can lower your risk for some STIs. You should also treat this if you are pregnant. It can cause your baby to be born early. Follow these instructions at home: Medicines  Take over-the-counter and prescription medicines only as told by your doctor.  Take or use your antibiotic medicine as told by your doctor. Do not stop taking or using it even if you start to feel better. General instructions  If you your sexual partner is a woman, tell her that you have this infection. She needs to get treatment if she has symptoms. If you have a female partner, he does not need to be treated.  During treatment: ? Avoid sex. ? Do not douche. ? Avoid alcohol as told. ? Avoid breastfeeding as told.  Drink enough fluid to keep your pee (urine) clear or pale yellow.  Keep your vagina and butt (rectum) clean. ? Wash the area with warm water every day. ? Wipe from front to back after you use the toilet.  Keep all follow-up visits as told by your doctor. This is important. Preventing this condition  Do not douche.  Use only warm water to wash around your vagina.  Use protection when you have sex. This includes: ? Latex condoms. ? Dental dams.  Limit how many people you have sex with. It is best to only have sex with the same person (be monogamous).  Get tested for STIs. Have your partner get tested.  Wear underwear that is cotton or lined with cotton.  Avoid tight pants and pantyhose. This is most important in summer.  Do not use any products that have nicotine or tobacco in them. These include cigarettes and e-cigarettes. If you need help quitting, ask your  doctor.  Do not use illegal drugs.  Limit how much alcohol you drink. Contact a doctor if:  Your symptoms do not get better, even after you are treated.  You have more discharge or pain when you pee (urinate).  You have a fever.  You have pain in your belly (abdomen).  You have pain with sex.  Your bleed from your vagina between periods. Summary  This infection happens when too many germs (bacteria) grow in the vagina.  Treating this condition can lower your risk for some infections from sex (STIs).  You should also treat this if you are pregnant. It can cause early (premature) birth.  Do not stop taking or using your antibiotic medicine even if you start to feel better. This information is not intended to replace advice given to you by your health care provider. Make sure you discuss any questions you have with your health care provider. Document Released: 12/08/2007 Document Revised: 11/14/2015 Document Reviewed: 11/14/2015 Elsevier Interactive Patient Education  2017 Elsevier Inc.     Medroxyprogesterone injection [Contraceptive] What is this medicine? MEDROXYPROGESTERONE (me DROX ee proe JES te rone) contraceptive injections prevent pregnancy. They provide effective birth control for 3 months. Depo-subQ Provera 104 is also used for treating pain related to endometriosis. This medicine may be used for other purposes; ask your health care provider or pharmacist if you have questions. COMMON BRAND NAME(S): Depo-Provera, Depo-subQ Provera 104 What should  I tell my health care provider before I take this medicine? They need to know if you have any of these conditions: -frequently drink alcohol -asthma -blood vessel disease or a history of a blood clot in the lungs or legs -bone disease such as osteoporosis -breast cancer -diabetes -eating disorder (anorexia nervosa or bulimia) -high blood pressure -HIV infection or AIDS -kidney disease -liver disease -mental  depression -migraine -seizures (convulsions) -stroke -tobacco smoker -vaginal bleeding -an unusual or allergic reaction to medroxyprogesterone, other hormones, medicines, foods, dyes, or preservatives -pregnant or trying to get pregnant -breast-feeding How should I use this medicine? Depo-Provera Contraceptive injection is given into a muscle. Depo-subQ Provera 104 injection is given under the skin. These injections are given by a health care professional. You must not be pregnant before getting an injection. The injection is usually given during the first 5 days after the start of a menstrual period or 6 weeks after delivery of a baby. Talk to your pediatrician regarding the use of this medicine in children. Special care may be needed. These injections have been used in female children who have started having menstrual periods. Overdosage: If you think you have taken too much of this medicine contact a poison control center or emergency room at once. NOTE: This medicine is only for you. Do not share this medicine with others. What if I miss a dose? Try not to miss a dose. You must get an injection once every 3 months to maintain birth control. If you cannot keep an appointment, call and reschedule it. If you wait longer than 13 weeks between Depo-Provera contraceptive injections or longer than 14 weeks between Depo-subQ Provera 104 injections, you could get pregnant. Use another method for birth control if you miss your appointment. You may also need a pregnancy test before receiving another injection. What may interact with this medicine? Do not take this medicine with any of the following medications: -bosentan This medicine may also interact with the following medications: -aminoglutethimide -antibiotics or medicines for infections, especially rifampin, rifabutin, rifapentine, and griseofulvin -aprepitant -barbiturate medicines such as phenobarbital or  primidone -bexarotene -carbamazepine -medicines for seizures like ethotoin, felbamate, oxcarbazepine, phenytoin, topiramate -modafinil -St. John's wort This list may not describe all possible interactions. Give your health care provider a list of all the medicines, herbs, non-prescription drugs, or dietary supplements you use. Also tell them if you smoke, drink alcohol, or use illegal drugs. Some items may interact with your medicine. What should I watch for while using this medicine? This drug does not protect you against HIV infection (AIDS) or other sexually transmitted diseases. Use of this product may cause you to lose calcium from your bones. Loss of calcium may cause weak bones (osteoporosis). Only use this product for more than 2 years if other forms of birth control are not right for you. The longer you use this product for birth control the more likely you will be at risk for weak bones. Ask your health care professional how you can keep strong bones. You may have a change in bleeding pattern or irregular periods. Many females stop having periods while taking this drug. If you have received your injections on time, your chance of being pregnant is very low. If you think you may be pregnant, see your health care professional as soon as possible. Tell your health care professional if you want to get pregnant within the next year. The effect of this medicine may last a long time after you get your last  injection. What side effects may I notice from receiving this medicine? Side effects that you should report to your doctor or health care professional as soon as possible: -allergic reactions like skin rash, itching or hives, swelling of the face, lips, or tongue -breast tenderness or discharge -breathing problems -changes in vision -depression -feeling faint or lightheaded, falls -fever -pain in the abdomen, chest, groin, or leg -problems with balance, talking, walking -unusually weak  or tired -yellowing of the eyes or skin Side effects that usually do not require medical attention (report to your doctor or health care professional if they continue or are bothersome): -acne -fluid retention and swelling -headache -irregular periods, spotting, or absent periods -temporary pain, itching, or skin reaction at site where injected -weight gain This list may not describe all possible side effects. Call your doctor for medical advice about side effects. You may report side effects to FDA at 1-800-FDA-1088. Where should I keep my medicine? This does not apply. The injection will be given to you by a health care professional. NOTE: This sheet is a summary. It may not cover all possible information. If you have questions about this medicine, talk to your doctor, pharmacist, or health care provider.  2018 Elsevier/Gold Standard (2008-03-21 18:37:56)   Intrauterine Device Information An intrauterine device (IUD) is inserted into your uterus to prevent pregnancy. There are two types of IUDs available:  Copper IUD-This type of IUD is wrapped in copper wire and is placed inside the uterus. Copper makes the uterus and fallopian tubes produce a fluid that kills sperm. The copper IUD can stay in place for 10 years.  Hormone IUD-This type of IUD contains the hormone progestin (synthetic progesterone). The hormone thickens the cervical mucus and prevents sperm from entering the uterus. It also thins the uterine lining to prevent implantation of a fertilized egg. The hormone can weaken or kill the sperm that get into the uterus. One type of hormone IUD can stay in place for 5 years, and another type can stay in place for 3 years.  Your health care provider will make sure you are a good candidate for a contraceptive IUD. Discuss with your health care provider the possible side effects. Advantages of an intrauterine device  IUDs are highly effective, reversible, long acting, and low  maintenance.  There are no estrogen-related side effects.  An IUD can be used when breastfeeding.  IUDs are not associated with weight gain.  The copper IUD works immediately after insertion.  The hormone IUD works right away if inserted within 7 days of your period starting. You will need to use a backup method of birth control for 7 days if the hormone IUD is inserted at any other time in your cycle.  The copper IUD does not interfere with your female hormones.  The hormone IUD can make heavy menstrual periods lighter and decrease cramping.  The hormone IUD can be used for 3 or 5 years.  The copper IUD can be used for 10 years. Disadvantages of an intrauterine device  The hormone IUD can be associated with irregular bleeding patterns.  The copper IUD can make your menstrual flow heavier and more painful.  You may experience cramping and vaginal bleeding after insertion. This information is not intended to replace advice given to you by your health care provider. Make sure you discuss any questions you have with your health care provider. Document Released: 02/02/2004 Document Revised: 08/06/2015 Document Reviewed: 08/19/2012 Elsevier Interactive Patient Education  2017 [employer].  Etonogestrel implant What is this medicine? ETONOGESTREL (et oh noe JES trel) is a contraceptive (birth control) device. It is used to prevent pregnancy. It can be used for up to 3 years. This medicine may be used for other purposes; ask your health care provider or pharmacist if you have questions. COMMON BRAND NAME(S): Implanon, Nexplanon What should I tell my health care provider before I take this medicine? They need to know if you have any of these conditions: -abnormal vaginal bleeding -blood vessel disease or blood clots -cancer of the breast, cervix, or liver -depression -diabetes -gallbladder disease -headaches -heart disease or recent heart attack -high blood pressure -high  cholesterol -kidney disease -liver disease -renal disease -seizures -tobacco smoker -an unusual or allergic reaction to etonogestrel, other hormones, anesthetics or antiseptics, medicines, foods, dyes, or preservatives -pregnant or trying to get pregnant -breast-feeding How should I use this medicine? This device is inserted just under the skin on the inner side of your upper arm by a health care professional. Talk to your pediatrician regarding the use of this medicine in children. Special care may be needed. Overdosage: If you think you have taken too much of this medicine contact a poison control center or emergency room at once. NOTE: This medicine is only for you. Do not share this medicine with others. What if I miss a dose? This does not apply. What may interact with this medicine? Do not take this medicine with any of the following medications: -amprenavir -bosentan -fosamprenavir This medicine may also interact with the following medications: -barbiturate medicines for inducing sleep or treating seizures -certain medicines for fungal infections like ketoconazole and itraconazole -grapefruit juice -griseofulvin -medicines to treat seizures like carbamazepine, felbamate, oxcarbazepine, phenytoin, topiramate -modafinil -phenylbutazone -rifampin -rufinamide -some medicines to treat HIV infection like atazanavir, indinavir, lopinavir, nelfinavir, tipranavir, ritonavir -St. John's wort This list may not describe all possible interactions. Give your health care provider a list of all the medicines, herbs, non-prescription drugs, or dietary supplements you use. Also tell them if you smoke, drink alcohol, or use illegal drugs. Some items may interact with your medicine. What should I watch for while using this medicine? This product does not protect you against HIV infection (AIDS) or other sexually transmitted diseases. You should be able to feel the implant by pressing your  fingertips over the skin where it was inserted. Contact your doctor if you cannot feel the implant, and use a non-hormonal birth control method (such as condoms) until your doctor confirms that the implant is in place. If you feel that the implant may have broken or become bent while in your arm, contact your healthcare provider. What side effects may I notice from receiving this medicine? Side effects that you should report to your doctor or health care professional as soon as possible: -allergic reactions like skin rash, itching or hives, swelling of the face, lips, or tongue -breast lumps -changes in emotions or moods -depressed mood -heavy or prolonged menstrual bleeding -pain, irritation, swelling, or bruising at the insertion site -scar at site of insertion -signs of infection at the insertion site such as fever, and skin redness, pain or discharge -signs of pregnancy -signs and symptoms of a blood clot such as breathing problems; changes in vision; chest pain; severe, sudden headache; pain, swelling, warmth in the leg; trouble speaking; sudden numbness or weakness of the face, arm or leg -signs and symptoms of liver injury like dark yellow or brown urine; general ill feeling or flu-like symptoms; light-colored  stools; loss of appetite; nausea; right upper belly pain; unusually weak or tired; yellowing of the eyes or skin -unusual vaginal bleeding, discharge -signs and symptoms of a stroke like changes in vision; confusion; trouble speaking or understanding; severe headaches; sudden numbness or weakness of the face, arm or leg; trouble walking; dizziness; loss of balance or coordination Side effects that usually do not require medical attention (report to your doctor or health care professional if they continue or are bothersome): -acne -back pain -breast pain -changes in weight -dizziness -general ill feeling or flu-like symptoms -headache -irregular menstrual bleeding -nausea -sore  throat -vaginal irritation or inflammation This list may not describe all possible side effects. Call your doctor for medical advice about side effects. You may report side effects to FDA at 1-800-FDA-1088. Where should I keep my medicine? This drug is given in a hospital or clinic and will not be stored at home. NOTE: This sheet is a summary. It may not cover all possible information. If you have questions about this medicine, talk to your doctor, pharmacist, or health care provider.  2018 Elsevier/Gold Standard (2015-09-17 11:19:22)

## 2017-07-12 NOTE — Telephone Encounter (Signed)
Please inform patient that her wet prep also showed yeast. Therefore I sent Diflucan to the pharmacy. Take one tablet by mouth. If she continues to have symptoms after 72 hours, she can take the second tablet.

## 2017-07-12 NOTE — Telephone Encounter (Signed)
Medication resent to pharmacy per patient request. Burnard Hawthorne

## 2017-07-12 NOTE — Telephone Encounter (Signed)
Pt called and wanted her Flagyl sent to the [facility] [address] instead of [facility].

## 2017-07-13 ENCOUNTER — Encounter: Payer: Self-pay | Admitting: Internal Medicine

## 2017-07-13 ENCOUNTER — Telehealth: Payer: Self-pay | Admitting: Internal Medicine

## 2017-07-13 LAB — HIV ANTIBODY (ROUTINE TESTING W REFLEX): HIV Screen 4th Generation wRfx: NONREACTIVE

## 2017-07-13 LAB — CERVICOVAGINAL ANCILLARY ONLY
CHLAMYDIA, DNA PROBE: POSITIVE — AB
Neisseria Gonorrhea: NEGATIVE

## 2017-07-13 LAB — RPR: RPR: NONREACTIVE

## 2017-07-13 NOTE — Telephone Encounter (Signed)
Discussed positive chlamydia results. She has a nurse visit for treatment with Azithromycin 1g PO x 1. Discussed abstaining from intercourse for 7 days after treatment and until her partner is adequately tested/treated. Will forward to RN box.

## 2017-07-13 NOTE — Telephone Encounter (Signed)
Patient informed of results. Doyce Saling,CMA  

## 2017-07-14 ENCOUNTER — Ambulatory Visit (INDEPENDENT_AMBULATORY_CARE_PROVIDER_SITE_OTHER): Payer: Self-pay

## 2017-07-14 ENCOUNTER — Telehealth: Payer: Self-pay | Admitting: Family Medicine

## 2017-07-14 DIAGNOSIS — A749 Chlamydial infection, unspecified: Secondary | ICD-10-CM

## 2017-07-14 MED ORDER — AZITHROMYCIN 500 MG PO TABS
1000.0000 mg | ORAL_TABLET | Freq: Once | ORAL | Status: AC
  Administered 2017-07-14: 1000 mg via ORAL

## 2017-07-14 MED ORDER — DOXYCYCLINE HYCLATE 100 MG PO TABS: 100.0000 mg | ORAL_TABLET | Freq: Two times a day (BID) | ORAL | 0 refills | Status: DC

## 2017-07-14 NOTE — Telephone Encounter (Signed)
Patient received treatment of Chlamydia today (Azithromycin). Left office and subsequently vomited (likely vomiting up medication). Needs treatment. Does not want to retry Azithromycin.  Sending in Doxy 100 mg BID x 10 days.

## 2017-07-14 NOTE — Progress Notes (Addendum)
   Patient in to nurse clinic for treatment of positive Chlamydia. Azithromycin 1 gram po given. Patient tolerated well. Condoms given. Counseling given regarding birth control methods in response to patient questions. Advised to abstain from sex for 7 days after treatment and for 7 days after partner is treated. Expressed understanding. Communicable disease form completed and sent to [facility]. Ples Specter, RN [facility] RN)   Patient called nurse line at [phone] to report throwing up after leaving office. Discussed retreatment with Azithromycin vs alternative med sent to pharmacy. Patient wishes to proceed with alternative medication. Consulted with preceptor, Dr. Adriana Simas. He will send in alternative. See other phone note. Patient aware. Ples Specter, RN [facility] RN)

## 2017-07-28 ENCOUNTER — Other Ambulatory Visit: Payer: Self-pay

## 2017-07-28 ENCOUNTER — Ambulatory Visit: Payer: Self-pay | Admitting: Family Medicine

## 2017-07-28 ENCOUNTER — Encounter: Payer: Self-pay | Admitting: Family Medicine

## 2017-07-28 ENCOUNTER — Ambulatory Visit (INDEPENDENT_AMBULATORY_CARE_PROVIDER_SITE_OTHER): Payer: Self-pay | Admitting: Family Medicine

## 2017-07-28 ENCOUNTER — Other Ambulatory Visit (HOSPITAL_COMMUNITY)
Admission: RE | Admit: 2017-07-28 | Discharge: 2017-07-28 | Disposition: A | Discharge status: Home / Self Care | Payer: Self-pay | Source: Ambulatory Visit | Attending: Family Medicine | Admitting: Family Medicine

## 2017-07-28 VITALS — BP 100/62 | HR 80 | Temp 98.4°F | Ht 60.0 in | Wt 164.2 lb

## 2017-07-28 DIAGNOSIS — Z0000 Encounter for general adult medical examination without abnormal findings: Secondary | ICD-10-CM

## 2017-07-28 DIAGNOSIS — Z113 Encounter for screening for infections with a predominantly sexual mode of transmission: Secondary | ICD-10-CM | POA: Insufficient documentation

## 2017-07-28 HISTORY — DX: Encounter for general adult medical examination without abnormal findings: Z00.00

## 2017-07-28 LAB — POCT WET PREP (WET MOUNT)
Clue Cells Wet Prep Whiff POC: NEGATIVE
Trichomonas Wet Prep HPF POC: ABSENT

## 2017-07-28 NOTE — Assessment & Plan Note (Addendum)
GC/Chlamydia tested today along with RPR and HIV.  Patient will be called with results, and appropriate treatment will be given.

## 2017-07-28 NOTE — Progress Notes (Signed)
   Subjective:    Julie Michael - 22 y.o. female MRN [id number]  Date of birth: October 09, 1995  HPI  Julie Michael is here for follow up after diagnosis and treatment of chlamydia, BV, and yeast infection.  She received PO azithromycin for treatment (she vomited after taking the pills the first time, but was able to keep them down the second time.  She was not able to take the doxycycline due to cost, so azithromycin was given again).  Her partner has been treated, and she has not had sex since her diagnosis.  She notes no changes in her vaginal discharge, but would like testing for GC/Chlamydia, wet prep, and HIV/RPR.    She is not currently on birth control because she has been worried about its side effects.She would like to talk more about birth control options in the future.  Health Maintenance:  - wants to receive TDAP but does not have insurance to cover this; advised to go to drug store to receive shot Health Maintenance Due  Topic Date Due  . TETANUS/TDAP  10/16/2016    -  reports that she has never smoked. She has never used smokeless tobacco. - Review of Systems: Per HPI. - Past Medical History: Patient Active Problem List   Diagnosis Date Noted  . Healthcare maintenance 07/28/2017  . Screen for STD (sexually transmitted disease) 07/28/2017  . Vaginal discharge 09/08/2016  . Routine screening for STI (sexually transmitted infection) 09/08/2016  . Contraception 09/08/2016  . Amenorrhea 04/13/2016  . Genital warts 01/23/2015  . Sinusitis, chronic 08/09/2013  . VISUAL ACUITY, DECREASED, LEFT EYE 05/26/2009   - Medications: reviewed and updated   Objective:   Physical Exam BP 100/62   Pulse 80   Temp 98.4 F (36.9 C) (Oral)   Ht 5' (1.524 m)   Wt 164 lb 3.2 oz (74.5 kg)   LMP 06/29/2017 (Exact Date)   SpO2 99%   BMI 32.07 kg/m  Gen: NAD, alert, cooperative with exam, well-appearing HEENT: NCAT, clear conjunctiva, supple neck CV: RRR, good S1/S2, no  murmur Resp: CTABL, no wheezes, non-labored Abd: SNTND, BS present, no guarding or organomegaly Skin: no rashes, normal turgor  Neuro: no gross deficits.  Psych: good insight, alert and oriented GU: small warts on vulva, normal vagina and cervix, normal appearing vaginal discharge       Assessment & Plan:   Screen for STD (sexually transmitted disease) GC/Chlamydia tested today along with RPR and HIV.  Patient will be called if any results are abnormal, and appropriate treatment will be given.  Healthcare maintenance Encouraged to consider contraception and to make an appointment anytime to discuss options.  Advised to wear a condom every time she has sex to prevent pregnancy.    Lezlie Octave, M.D. 07/28/2017, 9:18 AM PGY-1, [facility]

## 2017-07-28 NOTE — Patient Instructions (Addendum)
It was nice meeting you today Murlene!  Today, we performed testing for GC/Chlamydia, bacterial vaginosis, yeast, and trichomonas.  We also tested for HIV and syphilis with a blood test.    Please consider making an appointment to discuss contraception at your earliest convenience.  If you have any questions or concerns, please feel free to call the clinic.   Be well,  Dr. Frances Furbish

## 2017-07-28 NOTE — Assessment & Plan Note (Addendum)
Encouraged to consider contraception and to make an appointment anytime to discuss options.  Advised to wear a condom every time she has sex to prevent pregnancy.

## 2017-07-28 NOTE — Assessment & Plan Note (Signed)
>>  ASSESSMENT AND PLAN FOR SCREEN FOR STD (SEXUALLY TRANSMITTED DISEASE) WRITTEN ON 07/28/2017  9:23 AM BY Frances Furbish, [facility], MD  GC/Chlamydia tested today along with RPR and HIV.  Patient will be called with results, and appropriate treatment will be given.

## 2017-07-29 LAB — HIV ANTIBODY (ROUTINE TESTING W REFLEX): HIV SCREEN 4TH GENERATION: NONREACTIVE

## 2017-07-29 LAB — RPR: RPR Ser Ql: NONREACTIVE

## 2017-07-31 ENCOUNTER — Encounter: Payer: Self-pay | Admitting: *Deleted

## 2017-07-31 LAB — CERVICOVAGINAL ANCILLARY ONLY
CHLAMYDIA, DNA PROBE: NEGATIVE
Neisseria Gonorrhea: NEGATIVE

## 2017-09-20 ENCOUNTER — Other Ambulatory Visit: Payer: Self-pay

## 2017-09-20 ENCOUNTER — Ambulatory Visit (INDEPENDENT_AMBULATORY_CARE_PROVIDER_SITE_OTHER): Payer: Self-pay | Admitting: Family Medicine

## 2017-09-20 VITALS — BP 106/72 | HR 90 | Temp 98.5°F | Ht 60.0 in | Wt 167.8 lb

## 2017-09-20 DIAGNOSIS — Z3400 Encounter for supervision of normal first pregnancy, unspecified trimester: Secondary | ICD-10-CM

## 2017-09-20 DIAGNOSIS — Z3202 Encounter for pregnancy test, result negative: Secondary | ICD-10-CM

## 2017-09-20 DIAGNOSIS — Z3200 Encounter for pregnancy test, result unknown: Secondary | ICD-10-CM

## 2017-09-20 DIAGNOSIS — N912 Amenorrhea, unspecified: Secondary | ICD-10-CM

## 2017-09-20 LAB — POCT URINE PREGNANCY: PREG TEST UR: NEGATIVE

## 2017-09-20 NOTE — Patient Instructions (Signed)
It was a pleasure to see you today! Thank you for choosing [facility] for your primary care. Nance PearChristina Provence was seen for late period.   Our plans for today were:  You are not pregnant.   Go today and get prenatal vitamins, they need to contain 400IU of folic acid (which also all do), but otherwise the brand doesn't matter, gummies are fine. These help keep you and baby safe.   Consider the IUD, you can make an appt anytime.    Best,  Dr. Chanetta Marshallimberlake

## 2017-09-20 NOTE — Progress Notes (Signed)
   CC: late period  HPI  Took UPT 1 week ago, x2 negative. Wanted to check today for UPT. Not trying to get pregnant, but "if it happens it happens." not taking PNV. Has one partner who is aware of lack of birth control and on board. Hx of regular periods for the most part.   ROS: Denies CP, SOB, abdominal pain, dysuria, changes in BMs.   CC, SH/smoking status, and VS noted  Objective: BP 106/72   Pulse 90   Temp 98.5 F (36.9 C) (Oral)   Ht 5' (1.524 m)   Wt 167 lb 12.8 oz (76.1 kg)   LMP 08/10/2017   SpO2 98%   BMI 32.77 kg/m  Gen: NAD, alert, cooperative, and pleasant. HEENT: NCAT, EOMI, PERRL CV: RRR, no murmur Resp: CTAB, no wheezes, non-labored Ext: No edema, warm Neuro: Alert and oriented, Speech clear, No gross deficits  Assessment and plan:  Amenorrhea: UPT negative.  Counseled patient that occasionally menses can have late onset due to stress or hormonal changes.  Asked her to return if this is persistent.  Extensively counseled on options for birth control including larks, she is interested in returning soon to pursue the IUD.  Also explained to the numerous benefits of prenatal vitamins including decreased risk of spina bifida or neural tube defects, decreased risk of gestational hypertension, decreased risk of gestational diabetes.  She will pick up prenatal vitamins today.  Orders Placed This Encounter  Procedures  . Culture, OB Urine  . POCT urine pregnancy    No orders of the defined types were placed in this encounter.   Loni MuseKate Timberlake, MD, PGY3 09/20/2017 11:50 AM

## 2018-02-20 ENCOUNTER — Ambulatory Visit (INDEPENDENT_AMBULATORY_CARE_PROVIDER_SITE_OTHER): Payer: Self-pay | Admitting: Family Medicine

## 2018-02-20 ENCOUNTER — Other Ambulatory Visit (HOSPITAL_COMMUNITY)
Admission: RE | Admit: 2018-02-20 | Discharge: 2018-02-20 | Disposition: A | Discharge status: Home / Self Care | Payer: Self-pay | Source: Ambulatory Visit | Attending: Family Medicine | Admitting: Family Medicine

## 2018-02-20 VITALS — BP 120/82 | HR 86 | Temp 99.0°F | Wt 176.8 lb

## 2018-02-20 DIAGNOSIS — Z113 Encounter for screening for infections with a predominantly sexual mode of transmission: Secondary | ICD-10-CM | POA: Insufficient documentation

## 2018-02-20 DIAGNOSIS — N898 Other specified noninflammatory disorders of vagina: Secondary | ICD-10-CM

## 2018-02-20 LAB — POCT WET PREP (WET MOUNT)
Clue Cells Wet Prep Whiff POC: NEGATIVE
Trichomonas Wet Prep HPF POC: ABSENT

## 2018-02-20 NOTE — Assessment & Plan Note (Signed)
Sexually active.  Does not use contraception.  History of chlamydia with treatment and TOC.  Wet prep negative. - Discussed safe sex practice - Checking RPR, HIV, and GC/chlamydia - Reviewed return precautions - RTC with PCP at next available appointment to discuss contraception options

## 2018-02-20 NOTE — Patient Instructions (Addendum)
Thank you for coming in to see us today. Please see below to review our plan for today's visit.  It is important to have some means of contraception, otherwise every sexual intercourse you have is a chance for pregnancy.  I would encourage you to set up an appointment with your primary care physician to discuss means of contraception.  I will call you if there is any abnormalities on your other labs.  Results are obtained within 48 hours.  Please call the clinic at [phone] if your symptoms worsen or you have any concerns. It was our pleasure to serve you.  Durward Parcelavid McMullen, DO [facility], PGY-3

## 2018-02-20 NOTE — Assessment & Plan Note (Signed)
>>  ASSESSMENT AND PLAN FOR DISCHARGE FROM THE VAGINA WRITTEN ON 02/20/2018  4:55 PM BY MCMULLEN, DAVID J, DO  Sexually active.  Does not use contraception.  History of chlamydia with treatment and TOC.  Wet prep negative. - Discussed safe sex practice - Checking RPR, HIV, and GC/chlamydia - Reviewed return precautions - RTC with PCP at next available appointment to discuss contraception options

## 2018-02-20 NOTE — Progress Notes (Signed)
   Subjective   Patient ID: Julie Michael    DOB: Aug 18, 1995, 22 y.o. female   MRN: [id number]  CC: "Vaginal discharge"  HPI: Julie PearChristina Michael is a 22 y.o. female who presents to clinic today for the following:  VAGINAL DISCHARGE  Having vaginal discharge for 5 days. Medications tried: no Discharge consistency: thin Discharge color: green to clear Recent antibiotic use: amoxicillin back on 01/31/2018 for dental pain Sex in last month: yes, 1 female partner, same partner over last year Possible STD exposure: h/o Chlamydia with treatment and TOC, unsure if partner is faithful to her  Symptoms Fever: no Dysuria: no Vaginal bleeding: no Abdomen or Pelvic pain: no Back pain: no Genital sores or ulcers: no Rash: no Pain during sex: no Missed menstrual period: LMP 02/03/2018  ROS: see HPI for pertinent.  PMFSH: Reviewed. Smoking status reviewed. Medications reviewed.  Objective   BP 120/82   Pulse 86   Temp 99 F (37.2 C) (Oral)   Wt 176 lb 12.8 oz (80.2 kg)   SpO2 99%   BMI 34.53 kg/m  Vitals and nursing note reviewed.  General: well nourished, well developed, NAD with non-toxic appearance HEENT: normocephalic, atraumatic, moist mucous membranes Cardiovascular: regular rate and rhythm without murmurs, rubs, or gallops Lungs: clear to auscultation bilaterally with normal work of breathing Abdomen: soft, non-tender, non-distended, normoactive bowel sounds GU: accompanied by chaperone, no external lesions or rash, thin white discharge present, no bleeding in vaginal vault, cervical os nonfriable, no adnexal tenderness Skin: warm, dry, no rashes or lesions, cap refill < 2 seconds Extremities: warm and well perfused, normal tone, no edema  Assessment & Plan   Vaginal discharge Sexually active.  Does not use contraception.  History of chlamydia with treatment and TOC.  Wet prep negative. - Discussed safe sex practice - Checking RPR, HIV, and GC/chlamydia - Reviewed  return precautions - RTC with PCP at next available appointment to discuss contraception options  Orders Placed This Encounter  Procedures  . RPR  . HIV Antibody (routine testing w rflx)  . POCT Wet Prep [facility] [place])   No orders of the defined types were placed in this encounter.   Durward Parcelavid McMullen, DO [facility], PGY-3 02/20/2018, 4:59 PM

## 2018-02-21 LAB — CERVICOVAGINAL ANCILLARY ONLY
CHLAMYDIA, DNA PROBE: NEGATIVE
Neisseria Gonorrhea: NEGATIVE

## 2018-02-21 LAB — HIV ANTIBODY (ROUTINE TESTING W REFLEX): HIV SCREEN 4TH GENERATION: NONREACTIVE

## 2018-02-21 LAB — RPR: RPR Ser Ql: NONREACTIVE

## 2018-02-22 ENCOUNTER — Encounter: Payer: Self-pay | Admitting: Family Medicine

## 2018-03-30 ENCOUNTER — Other Ambulatory Visit: Payer: Self-pay

## 2018-03-30 ENCOUNTER — Ambulatory Visit (INDEPENDENT_AMBULATORY_CARE_PROVIDER_SITE_OTHER): Payer: Managed Care, Other (non HMO) | Admitting: Family Medicine

## 2018-03-30 VITALS — BP 100/60 | HR 66 | Temp 98.4°F | Ht 60.0 in | Wt 177.0 lb

## 2018-03-30 DIAGNOSIS — B379 Candidiasis, unspecified: Secondary | ICD-10-CM | POA: Diagnosis not present

## 2018-03-30 DIAGNOSIS — S39011A Strain of muscle, fascia and tendon of abdomen, initial encounter: Secondary | ICD-10-CM

## 2018-03-30 HISTORY — DX: Strain of muscle, fascia and tendon of abdomen, initial encounter: S39.011A

## 2018-03-30 HISTORY — DX: Candidiasis, unspecified: B37.9

## 2018-03-30 MED ORDER — IBUPROFEN 800 MG PO TABS: 800.0000 mg | ORAL_TABLET | Freq: Three times a day (TID) | ORAL | 0 refills | Status: DC | PRN

## 2018-03-30 MED ORDER — CYCLOBENZAPRINE HCL 5 MG PO TABS: 5.0000 mg | ORAL_TABLET | Freq: Three times a day (TID) | ORAL | 1 refills | Status: DC | PRN

## 2018-03-30 MED ORDER — FLUCONAZOLE 150 MG PO TABS: 150.0000 mg | ORAL_TABLET | Freq: Once | ORAL | 0 refills | Status: AC

## 2018-03-30 NOTE — Assessment & Plan Note (Signed)
Likely secondary to fatigue injury.  Will treat as an abdominal muscle strain.  Advised patient to rest over the weekend.  She is feeling okay on 1/20 she go back to exercising.  She still having a lot of pain advised her to continue to rest until 1/24.  She is still having a lot of pain she is come back and see [place].  Will treat with ibuprofen 800 mg every 8 hours, cyclobenzaprine 5 mg every 8 hours, topical liniments, rest, ice and heat.

## 2018-03-30 NOTE — Progress Notes (Signed)
HPI 23 year old female who presents for abdominal pain.  She states that since [place] she has been try to lose weight and has been going to the gym every single day.  She has been working out with a Systems analyst and that she has "been pushing really hard".  She states that on 1/15 she had a particularly hard workout and since that has been having tremendous abdominal pain.  The pain did not start immediately during the workout, but started hurting in the morning after.  She states that she had done intense core workout the night before.  Patient has not taken anything to relieve the pain.  She is having difficulty getting up from bed.  She has been able to work as normal.  Patient also states that she has been having white discharge, and itching in her vulvar area.  She is clear that the itching was not having during urination.  She feels like it is a yeast infection.  CC: Abdominal pain   ROS:   Review of Systems See HPI for ROS.   CC, SH/smoking status, and VS noted  Objective: BP 100/60 (BP Location: Right Arm, Patient Position: Sitting, Cuff Size: Normal)   Pulse 66   Temp 98.4 F (36.9 C) (Oral)   Ht 5' (1.524 m)   Wt 177 lb (80.3 kg)   SpO2 99%   BMI 34.57 kg/m  Gen: 23 year old African female, no acute distress, resting comfortably CV: RRR, no murmur Resp: CTAB, no wheezes, non-labored Abd: Patient with mild guarding to palp to palpation abdomen.  The distribution of her pain is limited exclusively to her upper middle and lower rectus abdominal muscles.  Unable to flex abdominal muscles due to pain. Neuro: Alert and oriented, Speech clear, No gross deficits   Assessment and plan:  Strain of abdominal muscle Likely secondary to fatigue injury.  Will treat as an abdominal muscle strain.  Advised patient to rest over the weekend.  She is feeling okay on 1/20 she go back to exercising.  She still having a lot of pain advised her to continue to rest until 1/24.  She  is still having a lot of pain she is come back and see [place].  Will treat with ibuprofen 800 mg every 8 hours, cyclobenzaprine 5 mg every 8 hours, topical liniments, rest, ice and heat.  Yeast infection Patient that she likely has yeast infection.  Says that she is "a little too sore" to get formal testing done.  Does not think it is a UTI.  We will go and treat with Diflucan 150 mg.  Patient advised to come back if symptoms do not resolve.   No orders of the defined types were placed in this encounter.   Meds ordered this encounter  Medications  . ibuprofen (ADVIL,MOTRIN) 800 MG tablet    Sig: Take 1 tablet (800 mg total) by mouth every 8 (eight) hours as needed.    Dispense:  30 tablet    Refill:  0  . cyclobenzaprine (FLEXERIL) 5 MG tablet    Sig: Take 1 tablet (5 mg total) by mouth 3 (three) times daily as needed for muscle spasms.    Dispense:  30 tablet    Refill:  1  . fluconazole (DIFLUCAN) 150 MG tablet    Sig: Take 1 tablet (150 mg total) by mouth once for 1 dose.    Dispense:  1 tablet    Refill:  0     Myrene Buddy MD PGY-2  Family Medicine Resident  03/30/2018 10:46 AM

## 2018-03-30 NOTE — Patient Instructions (Signed)
It was great meeting you today!  I am sorry you are having so much trouble with your abdominal pain.  I think this is likely just an overuse injury.  This is pretty common this time of the year before try to get healthy after the new year.  I recommend resting your abdominal muscles until Monday at the earliest.  Patient found that they are still very sore on Monday I recommend resting until next Friday.  It may spiral around and you are still having pain please come back and see [place].  Treatment for this will consist of a few different parts.  Try to ice the area down much as possible, and use a heating pad/take a warm shower when applicable.  You can also try topical liniments such as Biofreeze, icy hot, salonpas, etc. I will prescribe you ibuprofen 800 mg for anti-inflammatory purposes, and cyclobenzaprine 5 mg for muscle relaxer.  Please not drive or operate heavy machinery with the cyclobenzaprine.  This is likely best used at night as it can make you very sleepy.  Regarding your itching discomfort.  I think in the setting of recent antibiotic use we can see this is a yeast infection.  We will give Diflucan 150 mg 1 time.  If you find that the symptoms of not improved please come back to see [place].

## 2018-03-30 NOTE — Assessment & Plan Note (Signed)
Patient that she likely has yeast infection.  Says that she is "a little too sore" to get formal testing done.  Does not think it is a UTI.  We will go and treat with Diflucan 150 mg.  Patient advised to come back if symptoms do not resolve.

## 2018-04-24 ENCOUNTER — Ambulatory Visit: Payer: Managed Care, Other (non HMO) | Admitting: Family Medicine

## 2018-05-09 ENCOUNTER — Ambulatory Visit (INDEPENDENT_AMBULATORY_CARE_PROVIDER_SITE_OTHER): Payer: Managed Care, Other (non HMO) | Admitting: Family Medicine

## 2018-05-09 ENCOUNTER — Other Ambulatory Visit: Payer: Self-pay

## 2018-05-09 ENCOUNTER — Other Ambulatory Visit (HOSPITAL_COMMUNITY)
Admission: RE | Admit: 2018-05-09 | Discharge: 2018-05-09 | Disposition: A | Discharge status: Home / Self Care | Payer: Managed Care, Other (non HMO) | Source: Ambulatory Visit | Attending: Family Medicine | Admitting: Family Medicine

## 2018-05-09 ENCOUNTER — Ambulatory Visit: Payer: Managed Care, Other (non HMO)

## 2018-05-09 VITALS — BP 120/80 | HR 92 | Temp 98.8°F | Ht 60.0 in | Wt 180.4 lb

## 2018-05-09 DIAGNOSIS — B379 Candidiasis, unspecified: Secondary | ICD-10-CM

## 2018-05-09 DIAGNOSIS — N898 Other specified noninflammatory disorders of vagina: Secondary | ICD-10-CM | POA: Insufficient documentation

## 2018-05-09 LAB — POCT WET PREP (WET MOUNT)
Clue Cells Wet Prep Whiff POC: NEGATIVE
Trichomonas Wet Prep HPF POC: ABSENT

## 2018-05-09 NOTE — Progress Notes (Signed)
   Subjective:   Patient ID: Julie Michael    DOB: [birth year], 23 y.o. female   MRN: [id number]  CC: concern for yeast infection   HPI: Julie Michael is a 23 y.o. female who presents to clinic today for the following issue.  Vaginal discharge White vaginal discharge x 2 weeks duration.  Associated with odor and burning/irritation with wiping.  She is sexually active with males only.  No new recent partners.  She uses condoms only for contraception, LMP 04/26/2018 H/o chlamydia last year which was treated and negative on retesting  She desires STD testing today to be sure she has not acquired any new infection.   ROS: No fevers, chills, nausea, vomiting.  No abdominal pain, dysuria, frequency or urgency.   Social: pt is a never smoker.  Medications reviewed. Objective:   BP 120/80   Pulse 92   Temp 98.8 F (37.1 C) (Oral)   Ht 5' (1.524 m)   Wt 180 lb 6.4 oz (81.8 kg)   SpO2 97%   BMI 35.23 kg/m  Vitals and nursing note reviewed.  General: pleasant 23 yo female, NAD  CV: RRR no MRG  Lungs: CTAB, normal effort  Abdomen: soft, +bs  Pelvic exam: VULVA: normal appearing vulva with no masses, tenderness or lesions, VAGINA: normal appearing vagina with normal color, small amount of white discharge present within vaginal vault, no lesions, CERVIX: normal appearing cervix without discharge or lesions. Skin: warm, dry, no rash Extremities: warm and well perfused   Assessment & Plan:   Vaginal discharge -Wet prep, GC chlamydia today -HIV and RPR declined -Counseled on safe sex, advised to start LARC as she does not desire pregnancy at this time, she states she will consider her options, encouraged f/u with PCP once she has decided  -will f/u results and treat as appropriate   Orders Placed This Encounter  Procedures  . POCT Wet Prep [facility])   Freddrick March, MD [facility] PGY-3

## 2018-05-09 NOTE — Patient Instructions (Signed)
It was nice meeting you today.  You were seen in clinic for vaginal discharge and were tested for STDs.  As we discussed, your symptoms are most likely due to a yeast infection however I will call you once I have the results of your tests and will call in medication if needed.  Please call clinic if you have any questions.  Freddrick March MD

## 2018-05-11 LAB — CERVICOVAGINAL ANCILLARY ONLY
Chlamydia: NEGATIVE
NEISSERIA GONORRHEA: NEGATIVE

## 2018-08-09 ENCOUNTER — Ambulatory Visit (INDEPENDENT_AMBULATORY_CARE_PROVIDER_SITE_OTHER): Payer: Managed Care, Other (non HMO) | Admitting: Family Medicine

## 2018-08-09 ENCOUNTER — Other Ambulatory Visit: Payer: Self-pay

## 2018-08-09 ENCOUNTER — Other Ambulatory Visit (HOSPITAL_COMMUNITY)
Admission: RE | Admit: 2018-08-09 | Discharge: 2018-08-09 | Disposition: A | Discharge status: Home / Self Care | Payer: Managed Care, Other (non HMO) | Source: Ambulatory Visit | Attending: Family Medicine | Admitting: Family Medicine

## 2018-08-09 VITALS — BP 98/70 | HR 82

## 2018-08-09 DIAGNOSIS — N898 Other specified noninflammatory disorders of vagina: Secondary | ICD-10-CM | POA: Insufficient documentation

## 2018-08-09 DIAGNOSIS — B373 Candidiasis of vulva and vagina: Secondary | ICD-10-CM | POA: Diagnosis not present

## 2018-08-09 DIAGNOSIS — Z113 Encounter for screening for infections with a predominantly sexual mode of transmission: Secondary | ICD-10-CM

## 2018-08-09 DIAGNOSIS — B3731 Acute candidiasis of vulva and vagina: Secondary | ICD-10-CM

## 2018-08-09 LAB — POCT WET PREP (WET MOUNT)
Clue Cells Wet Prep Whiff POC: NEGATIVE
Trichomonas Wet Prep HPF POC: ABSENT

## 2018-08-09 MED ORDER — FLUCONAZOLE 150 MG PO TABS: 150.0000 mg | ORAL_TABLET | Freq: Once | ORAL | 0 refills | Status: AC

## 2018-08-09 NOTE — Assessment & Plan Note (Addendum)
Few yeast on wet prep.  Absent trichomoniasis or clue cells. No signs of oral SDT infection. Less concern for PID based on clinical exam. - Given prescription for fluconazole 150 mg tablet - Checking GC/chlamydia, HIV, RPR - Discussed safe sex practices

## 2018-08-09 NOTE — Patient Instructions (Signed)
Thank you for coming in to see [place] today. Please see below to review our plan for today's visit.  I will call you if your labs are abnormal within 48 hours, otherwise you will receive results in the mail. Please schedule a follow-up with your PCP to discuss birth control. Use condoms at all times during intercourse to prevent infection, HIV, and pregnancy.  Please call the clinic at [phone] if your symptoms worsen or you have any concerns. It was our pleasure to serve you.  Durward Parcel, DO [facility], PGY-3

## 2018-08-09 NOTE — Assessment & Plan Note (Signed)
>>  ASSESSMENT AND PLAN FOR DISCHARGE FROM THE VAGINA WRITTEN ON 08/09/2018 10:21 AM BY MCMULLEN, DAVID J, DO  Few yeast on wet prep.  Absent trichomoniasis or clue cells. No signs of oral SDT infection. Less concern for PID based on clinical exam. - Given prescription for fluconazole 150 mg tablet - Checking GC/chlamydia, HIV, RPR - Discussed safe sex practices

## 2018-08-09 NOTE — Progress Notes (Addendum)
   Subjective   Patient ID: Graecyn Pinkelman    DOB: [birth year], 23 y.o. female   MRN: [id number]  CC: "vaginal infection"  HPI: Thuyvi Rayner is a 23 y.o. female who presents to clinic today for the following:  VAGINAL DISCHARGE  Having vaginal discharge for 2 months. Medications tried: Tylenol for her LMP Discharge consistency: thick Discharge color: greens Recent antibiotic use: no0 Sex in last month: yes Possible STD exposure: unsure  Symptoms Fever: no Dysuria: no Vaginal bleeding: no Abdomen or Pelvic pain: no Back pain: no Genital sores or ulcers: no Rash: no Pain during sex: yes Missed menstrual period: 07/30/2018 Had 3 female partners in last 12 months without known STD No sore throat  ROS: see HPI for pertinent.  PMFSH: Reviewed. Smoking status reviewed. Medications reviewed.  Objective   BP 98/70   Pulse 82   LMP 07/30/2018   SpO2 94%  Vitals and nursing note reviewed.  General: well nourished, well developed, NAD with non-toxic appearance HEENT: normocephalic, atraumatic, moist mucous membranes with nonerythematous oropharynx Neck: supple, non-tender without lymphadenopathy Lungs: normal work of breathing Abdomen: soft, non-tender, non-distended, normoactive bowel sounds GU: accompanied by chaperone, no external lesion or rash, absent blood in vaginal vault, thick green discharge present, no foul odor, cervical os nonfriable, tenderness to introitus but no adnexal tenderness Skin: warm, dry, no rashes or lesions Extremities: warm and well perfused, normal tone, no edema  Assessment & Plan   Vaginal discharge Few yeast on wet prep.  Absent trichomoniasis or clue cells. No signs of oral SDT infection. Less concern for PID based on clinical exam. - Given prescription for fluconazole 150 mg tablet - Checking GC/chlamydia, HIV, RPR - Discussed safe sex practices  Orders Placed This Encounter  Procedures  . HIV antibody (with reflex)  . RPR  . POCT  Wet Prep [facility])   Meds ordered this encounter  Medications  . fluconazole (DIFLUCAN) 150 MG tablet    Sig: Take 1 tablet (150 mg total) by mouth once for 1 dose.    Dispense:  1 tablet    Refill:  0    Durward Parcel, DO [facility], PGY-3 08/09/2018, 10:21 AM

## 2018-08-10 LAB — CERVICOVAGINAL ANCILLARY ONLY
Chlamydia: NEGATIVE
Neisseria Gonorrhea: NEGATIVE

## 2018-08-10 LAB — HIV ANTIBODY (ROUTINE TESTING W REFLEX): HIV Screen 4th Generation wRfx: NONREACTIVE

## 2018-08-10 LAB — RPR: RPR Ser Ql: NONREACTIVE

## 2018-09-11 ENCOUNTER — Other Ambulatory Visit: Payer: Self-pay

## 2018-09-11 ENCOUNTER — Other Ambulatory Visit: Payer: Managed Care, Other (non HMO)

## 2018-09-11 ENCOUNTER — Telehealth: Payer: Self-pay | Admitting: *Deleted

## 2018-09-11 ENCOUNTER — Telehealth (INDEPENDENT_AMBULATORY_CARE_PROVIDER_SITE_OTHER): Payer: Managed Care, Other (non HMO) | Admitting: Student in an Organized Health Care Education/Training Program

## 2018-09-11 DIAGNOSIS — Z20828 Contact with and (suspected) exposure to other viral communicable diseases: Secondary | ICD-10-CM | POA: Diagnosis not present

## 2018-09-11 DIAGNOSIS — Z20822 Contact with and (suspected) exposure to covid-19: Secondary | ICD-10-CM

## 2018-09-11 HISTORY — DX: Contact with and (suspected) exposure to covid-19: Z20.822

## 2018-09-11 NOTE — Assessment & Plan Note (Signed)
-   patient electronically referred for drive-up testing - social distancing precautions reviewed - strict call-back or ED precautions if she develops dyspnea - all questions answered

## 2018-09-11 NOTE — Telephone Encounter (Signed)
-----   Message from Everrett Coombe, MD sent at 09/11/2018  8:48 AM EDT ----- Regarding: COVID19 test referral COVID Drive-Up Test Referral Criteria  Patient age: 23 yo F  Symptoms: New loss of smell or taste  Underlying Conditions: No underlying conditions  Is the patient a first responder? No  Does the patient live or work in a high risk or high density environment: No  Is the patient a COVID convalescent patient who is 14-28 days symptom-free and interested in donating plasma for use as a therapeutic product? No

## 2018-09-11 NOTE — Telephone Encounter (Signed)
Scheduled patient for today at [facility] at 3 pm.  Testing protocol reviewed.

## 2018-09-11 NOTE — Progress Notes (Signed)
[facility] Telemedicine Visit  Patient consented to have virtual visit. Method of visit: Video  Encounter participants: Patient: Julie Michael - located at Home Provider: Everrett Coombe - located at [facility] Others (if applicable): None  Chief Complaint: Concern about exposure   HPI: Patient reports concern for exposure to [place]. She reports that her friend tested positive for the virus on 6/25. She came in contact with a member of the [place] person's household after this. The patient has had no symptoms other than feeling like she has had decreased sense of taste for one day. No fever, sore throat, fatigue, muscle aches, rhinorrhea, or congestion. She works as a Publishing copy mostly over the phone. She lives at home with 3 roommates.  COVID Drive-Up Test Referral Criteria  Patient age: 23 y.o.  Symptoms: New loss of smell or taste  Underlying Conditions: No underlying conditions  Is the patient a first responder? No  Does the patient live or work in a high risk or high density environment: No  Is the patient a COVID convalescent patient who is 14-28 days symptom-free and interested in donating plasma for use as a therapeutic product? No  ROS: per HPI  Pertinent PMHx: None  Exam:  Respiratory: Comfortable-appearing, speaks in full sentences  Assessment/Plan:  Exposure to Covid-19 Virus - patient electronically referred for drive-up testing - social distancing precautions reviewed - strict call-back or ED precautions if she develops dyspnea - all questions answered    Time spent during visit with patient: 11 minutes

## 2018-09-17 LAB — NOVEL CORONAVIRUS, NAA: SARS-CoV-2, NAA: NOT DETECTED

## 2018-11-15 ENCOUNTER — Ambulatory Visit (INDEPENDENT_AMBULATORY_CARE_PROVIDER_SITE_OTHER): Payer: Managed Care, Other (non HMO) | Admitting: Family Medicine

## 2018-11-15 ENCOUNTER — Other Ambulatory Visit: Payer: Self-pay

## 2018-11-15 VITALS — BP 125/80 | HR 103 | Wt 183.0 lb

## 2018-11-15 DIAGNOSIS — Z3200 Encounter for pregnancy test, result unknown: Secondary | ICD-10-CM | POA: Diagnosis not present

## 2018-11-15 DIAGNOSIS — N912 Amenorrhea, unspecified: Secondary | ICD-10-CM | POA: Diagnosis not present

## 2018-11-15 LAB — POCT URINE PREGNANCY: Preg Test, Ur: NEGATIVE

## 2018-11-15 MED ORDER — PRENATAL VITAMIN AND MINERAL 28-0.8 MG PO TABS: 1.0000 | ORAL_TABLET | Freq: Every day | ORAL | 12 refills | Status: DC

## 2018-11-15 NOTE — Progress Notes (Signed)
   Subjective:    Patient ID: Julie Michael, female    DOB: 1995/10/23, 23 y.o.   MRN: [id number]   CC: missed period  HPI: Amenorrhea Patient presenting today after missing her period.  States that her LMP was September 29, 2018.  States that she is having unprotected intercourse with 1 partner, last unprotected intercourse was in August but was having weekly unprotected intercourse.  Took 2 pregnancy test at home last week which were negative.  This week she also took one test which was negative.  Spotted a few weeks ago which is usually a sign that she is about to get her period but has not gotten her period yet.  Before this month she has been very regular with monthly periods.  States that she did have one missed period 2 years ago but at that time was on birth control.  Has not been on birth control for 2 years now.  No family history of amenorrhea.  No family or personal history of PCOS.  Does report 10 pound weight gain in the past month but has been eating more.  Denies any hair changes.  Denies any acne.  Denies any breast tenderness.  Denies any vaginal discharge.  Objective:  BP 125/80   Pulse (!) 103   Wt 183 lb (83 kg)   SpO2 99%   BMI 35.74 kg/m  Vitals and nursing note reviewed  General: well nourished, in no acute distress HEENT: normocephalic, no scleral icterus or conjunctival pallor Cardiac: RRR, clear S1 and S2, no murmurs, rubs, or gallops Respiratory: clear to auscultation bilaterally, no increased work of breathing Abdomen: soft, nontender, nondistended, no masses or organomegaly. Bowel sounds present Extremities: no edema or cyanosis. Warm, well perfused.  Skin: warm and dry, no rashes noted Neuro: alert and oriented, no focal deficits   Assessment & Plan:    Amenorrhea Unclear etiology at this time.  Can be normal hormonal changes.  Urine pregnancy test negative in office.  Will order beta hCG quant to rule out early pregnancy that was not detected on urine  pregnancy.  Patient does not desire contraceptive at this time as she desires to have pregnancy.  Will start prenatal vitamin and stop both Flexeril and ibuprofen to help prepare for possible pregnancy.  Advised to continue to eat healthy and daily exercise as staying healthy will increase the chances of a healthy pregnancy.  Strict return precautions given.  If patient continues to have amenorrhea lasting 3 months time will initiate work-up for causes at that time.  Follow-up in 2 months.    Return in about 2 months (around 01/15/2019).   Caroline More, DO, PGY-3

## 2018-11-15 NOTE — Assessment & Plan Note (Signed)
Unclear etiology at this time.  Can be normal hormonal changes.  Urine pregnancy test negative in office.  Will order beta hCG quant to rule out early pregnancy that was not detected on urine pregnancy.  Patient does not desire contraceptive at this time as she desires to have pregnancy.  Will start prenatal vitamin and stop both Flexeril and ibuprofen to help prepare for possible pregnancy.  Advised to continue to eat healthy and daily exercise as staying healthy will increase the chances of a healthy pregnancy.  Strict return precautions given.  If patient continues to have amenorrhea lasting 3 months time will initiate work-up for causes at that time.  Follow-up in 2 months.

## 2018-11-15 NOTE — Patient Instructions (Signed)
Primary Amenorrhea  Primary amenorrhea is when a female has not started having periods by the age of 15 years. What are the causes? This condition may be caused by:  An abnormal chromosome that causes the ovaries not to work properly (common).  Malnutrition.  Low blood sugar (hypoglycemia).  Polycystic ovary syndrome.  Being born without a vagina, uterus, or ovaries.  Extreme obesity.  Cystic fibrosis.  Drastic weight loss.  Over-exercising that leads to a loss of body fat.  Pituitary gland tumor in the brain.  Long-term illnesses.  Cushing disease.  A thyroid disease, such as hypothyroidism or hyperthyroidism.  A part of the brain called the hypothalamus not working normally.  Premature ovarian failure. What are the signs or symptoms? The main symptom of this condition is not having had a period by age 15 years. Other symptoms include:  Discharge from the breasts.  Hot flashes.  Adult acne.  Facial or chest hair.  Headaches.  Impaired vision.  Recent excessive stress.  Changes in weight, diet, or exercise patterns. How is this diagnosed? This condition may be diagnosed based on:  Your medical history.  A physical exam.  Tests, such as: ? Blood tests. ? Urine tests. How is this treated? Treatment for this condition depends on the cause. For example, an absence of sex organs will require surgery to correct the problem. Others causes may respond when treated with medicine. Follow these instructions at home:  Maintain a healthy diet.  Maintain a healthy weight.  Exercise regularly but not excessively.  Take over-the-counter and prescription medicines only as told by your health care provider.  Keep all follow-up visits as told by your health care provider. This is important. Contact a health care provider if:  Your body is not developing at the level of your peers.  You have pelvic pain.  You gain an unusual amount of weight.  You have  an unusual amount of hair growth. Summary  Primary amenorrhea is when a female has not started having periods by the age of 15 years.  This condition has many causes, including abnormal ovaries, obesity, extreme weight loss.  Contact a health care provider if your body is not developing at the level of your peers. This information is not intended to replace advice given to you by your health care provider. Make sure you discuss any questions you have with your health care provider. Document Released: 02/28/2005 Document Revised: 08/10/2017 Document Reviewed: 05/17/2016 Elsevier Patient Education  2020 Elsevier Inc.  

## 2018-11-15 NOTE — Addendum Note (Signed)
Addended by: Eppie Gibson on: 11/15/2018 10:08 AM   Modules accepted: Orders

## 2018-11-16 ENCOUNTER — Encounter: Payer: Self-pay | Admitting: Family Medicine

## 2018-11-16 LAB — BETA HCG QUANT (REF LAB): hCG Quant: <1 m[IU]/mL

## 2018-12-20 ENCOUNTER — Ambulatory Visit (INDEPENDENT_AMBULATORY_CARE_PROVIDER_SITE_OTHER): Payer: Managed Care, Other (non HMO) | Admitting: Family Medicine

## 2018-12-20 ENCOUNTER — Other Ambulatory Visit: Payer: Self-pay

## 2018-12-20 ENCOUNTER — Other Ambulatory Visit (HOSPITAL_COMMUNITY)
Admission: RE | Admit: 2018-12-20 | Discharge: 2018-12-20 | Disposition: A | Discharge status: Home / Self Care | Payer: Managed Care, Other (non HMO) | Source: Ambulatory Visit | Attending: Family Medicine | Admitting: Family Medicine

## 2018-12-20 VITALS — BP 106/62 | HR 99 | Wt 183.6 lb

## 2018-12-20 DIAGNOSIS — N949 Unspecified condition associated with female genital organs and menstrual cycle: Secondary | ICD-10-CM

## 2018-12-20 DIAGNOSIS — N898 Other specified noninflammatory disorders of vagina: Secondary | ICD-10-CM | POA: Insufficient documentation

## 2018-12-20 DIAGNOSIS — B9689 Other specified bacterial agents as the cause of diseases classified elsewhere: Secondary | ICD-10-CM

## 2018-12-20 DIAGNOSIS — N760 Acute vaginitis: Secondary | ICD-10-CM

## 2018-12-20 DIAGNOSIS — B373 Candidiasis of vulva and vagina: Secondary | ICD-10-CM

## 2018-12-20 DIAGNOSIS — B3731 Acute candidiasis of vulva and vagina: Secondary | ICD-10-CM

## 2018-12-20 LAB — POCT URINE PREGNANCY: Preg Test, Ur: NEGATIVE

## 2018-12-20 LAB — POCT WET PREP (WET MOUNT)
Clue Cells Wet Prep Whiff POC: POSITIVE
Trichomonas Wet Prep HPF POC: ABSENT

## 2018-12-20 NOTE — Patient Instructions (Signed)
It was a pleasure to see you today! Thank you for choosing [facility] for your primary care. Julie Michael was seen for discharge and lesion. Come back to the clinic if there is anything else we can do for you.  Today we had a number of test to try and evaluate the cause for your discharge in the sore.  We will let you know soon as they come back and via my chart.  Please remember if you ever want hormonal birth control he can come back and talk to [place].   Please bring all your medications to every doctors visit   Sign up for My Chart to have easy access to your labs results, and communication with your Primary care physician.     Please check-out at the front desk before leaving the clinic.     Best,  Dr. Sherene Sires FAMILY MEDICINE RESIDENT - PGY3 12/20/2018 2:32 PM

## 2018-12-21 ENCOUNTER — Encounter: Payer: Self-pay | Admitting: Family Medicine

## 2018-12-21 DIAGNOSIS — B9689 Other specified bacterial agents as the cause of diseases classified elsewhere: Secondary | ICD-10-CM | POA: Insufficient documentation

## 2018-12-21 DIAGNOSIS — N760 Acute vaginitis: Secondary | ICD-10-CM | POA: Insufficient documentation

## 2018-12-21 DIAGNOSIS — N949 Unspecified condition associated with female genital organs and menstrual cycle: Secondary | ICD-10-CM

## 2018-12-21 DIAGNOSIS — B3731 Acute candidiasis of vulva and vagina: Secondary | ICD-10-CM | POA: Insufficient documentation

## 2018-12-21 DIAGNOSIS — B373 Candidiasis of vulva and vagina: Secondary | ICD-10-CM | POA: Insufficient documentation

## 2018-12-21 HISTORY — DX: Acute candidiasis of vulva and vagina: B37.31

## 2018-12-21 HISTORY — DX: Unspecified condition associated with female genital organs and menstrual cycle: N94.9

## 2018-12-21 HISTORY — DX: Other specified bacterial agents as the cause of diseases classified elsewhere: B96.89

## 2018-12-21 MED ORDER — METRONIDAZOLE 500 MG PO TABS: 500.0000 mg | ORAL_TABLET | Freq: Three times a day (TID) | ORAL | 0 refills | Status: DC

## 2018-12-21 MED ORDER — FLUCONAZOLE 150 MG PO TABS: 150.0000 mg | ORAL_TABLET | Freq: Once | ORAL | 0 refills | Status: AC

## 2018-12-21 NOTE — Assessment & Plan Note (Signed)
>>  ASSESSMENT AND PLAN FOR DISCHARGE FROM THE VAGINA WRITTEN ON 12/21/2018  6:09 PM BY BLAND, SCOTT, DO  Patient complaining of increased discharge, no dysuria, no pelvic pain, no belly pain, no bleeding.  Swabs performed and positive for yeast infection and BV.    Vaginal yeast infection and BV medication sent to the pharmacy, patient informed via my chart

## 2018-12-21 NOTE — Assessment & Plan Note (Signed)
Patient complaining of a singular lesion on lower right labia.  This is painful to the touch and she said at one point it appeared there was a "skin "over it that when she was washing she ended up scrubbing off and it bled a small amount.  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  This is the only lesion visualized or complained about by the patient  Lesion swab for HSV and culture still pending at the time of writing this note

## 2018-12-21 NOTE — Progress Notes (Signed)
Subjective:  Julie Michael is a 23 y.o. female who presents to the [facility] today with a chief complaint of discharge and painful vaginal lesion.   HPI: Discharge from the vagina Patient complaining of increased discharge, no dysuria, no pelvic pain, no belly pain, no bleeding.   Patient is safe and says no one is making her do anything she does not want to do  Genital lesion, female Patient complaining of a singular lesion on lower right labia.  This is painful to the touch and she said at one point it appeared there was a "skin "over it that when she was washing she ended up scrubbing off and it bled a small amount.    Objective:  Physical Exam: BP 106/62   Pulse 99   Wt 183 lb 9.6 oz (83.3 kg)   LMP 11/20/2018 (Exact Date)   SpO2 97%   BMI 35.86 kg/m   Gen: NAD, reversing comfortably CV: RRR with no murmurs appreciated Pulm: NWOB, CTAB with no crackles, wheezes, or rhonchi *Genital exam and swab performed entirely with CMA Sheri in the room.*External visual examination unremarkable for pathology with the exception of lesion approximately 1.5 cm in diameter on right medial labia.  This is painful to the touch but is hemostatic with no purulence or surrounding cellulitis.  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 MSK: no edema, cyanosis, or clubbing noted Skin: warm, dry Neuro: grossly normal, moves all extremities Psych: Normal affect and thought content  Results for orders placed or performed in visit on 12/20/18 (from the past 72 hour(s))  POCT Wet Prep Mellody Drown Lyons)     Status: Abnormal   Collection Time: 12/20/18  2:40 PM  Result Value Ref Range   Source Wet Prep POC VAG    WBC, Wet Prep HPF POC 1-5    Bacteria Wet Prep HPF POC Moderate (A) Few   Clue Cells Wet Prep HPF POC Few (A) None   Clue Cells Wet Prep Whiff POC Positive Whiff    Yeast Wet Prep HPF POC Few (A) None   Trichomonas Wet Prep HPF POC Absent Absent  POCT urine pregnancy     Status: None   Collection Time: 12/20/18  2:50 PM  Result Value Ref Range   Preg Test, Ur Negative Negative     Assessment/Plan:  BV (bacterial vaginosis) Patient complaining of increased discharge, swab was obtained.  Positive for BV  Metronidazole sent to pharmacy, patient informed via my chart as requested  Vaginal yeast infection Patient complaining of increased discharge, swab showed positive for yeast.  Diflucan sent to pharmacy, patient informed by my chart.  Genital lesion, female Patient complaining of a singular lesion on lower right labia.  This is painful to the touch and she said at one point it appeared there was a "skin "over it that when she was washing she ended up scrubbing off and it bled a small amount.  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  This is the only lesion visualized or complained about by the patient  Lesion swab for HSV and culture still pending at the time of writing this note  Discharge from the vagina Patient complaining of increased discharge, no dysuria, no pelvic pain, no belly pain, no bleeding.  Swabs performed and positive for yeast infection and BV.    Vaginal yeast infection and BV medication sent to the pharmacy, patient informed  via my chart   Sherene Sires, [place] - PGY3 12/21/2018 6:09 PM

## 2018-12-21 NOTE — Assessment & Plan Note (Signed)
Patient complaining of increased discharge, swab showed positive for yeast.  Diflucan sent to pharmacy, patient informed by my chart.

## 2018-12-21 NOTE — Assessment & Plan Note (Signed)
Patient complaining of increased discharge, swab was obtained.  Positive for BV  Metronidazole sent to pharmacy, patient informed via my chart as requested

## 2018-12-21 NOTE — Assessment & Plan Note (Addendum)
Patient complaining of increased discharge, no dysuria, no pelvic pain, no belly pain, no bleeding.  Swabs performed and positive for yeast infection and BV.    Vaginal yeast infection and BV medication sent to the pharmacy, patient informed via my chart

## 2018-12-22 LAB — SPECIMEN STATUS REPORT

## 2018-12-23 LAB — HERPES SIMPLEX VIRUS CULTURE

## 2018-12-24 ENCOUNTER — Telehealth: Payer: Self-pay | Admitting: Family Medicine

## 2018-12-24 DIAGNOSIS — B009 Herpesviral infection, unspecified: Secondary | ICD-10-CM

## 2018-12-24 MED ORDER — ACYCLOVIR 400 MG PO TABS: 400.0000 mg | ORAL_TABLET | Freq: Three times a day (TID) | ORAL | 0 refills | Status: AC

## 2018-12-24 NOTE — Telephone Encounter (Signed)
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  Called patient and instructed her about taking acyclovir for a week and to remember to report this to her physician if she ever gets pregnant.  She is also been instructed to inform her partner to go get tested and treated,  Dr. Criss Rosales

## 2018-12-31 LAB — CERVICOVAGINAL ANCILLARY ONLY
Chlamydia: NEGATIVE
Comment: NEGATIVE
Comment: NEGATIVE
Comment: NORMAL
Neisseria Gonorrhea: NEGATIVE
Trichomonas: NEGATIVE

## 2019-04-19 ENCOUNTER — Ambulatory Visit: Payer: Managed Care, Other (non HMO) | Attending: Internal Medicine

## 2019-04-19 DIAGNOSIS — Z20822 Contact with and (suspected) exposure to covid-19: Secondary | ICD-10-CM | POA: Insufficient documentation

## 2019-04-20 LAB — NOVEL CORONAVIRUS, NAA: SARS-CoV-2, NAA: NOT DETECTED

## 2019-04-22 ENCOUNTER — Other Ambulatory Visit: Payer: Managed Care, Other (non HMO)

## 2019-05-20 ENCOUNTER — Telehealth: Payer: Self-pay | Admitting: Family Medicine

## 2019-05-20 ENCOUNTER — Encounter: Payer: Self-pay | Admitting: Family Medicine

## 2019-05-20 ENCOUNTER — Ambulatory Visit: Payer: Managed Care, Other (non HMO) | Attending: Internal Medicine

## 2019-05-20 ENCOUNTER — Other Ambulatory Visit: Payer: Managed Care, Other (non HMO)

## 2019-05-20 DIAGNOSIS — Z20822 Contact with and (suspected) exposure to covid-19: Secondary | ICD-10-CM

## 2019-05-20 NOTE — Telephone Encounter (Signed)
Pt is calling and would like for Dr. Frances Furbish to write a letter stating she has finished her quarantine from being covid positive. She needs the letter to state that even though she has completed her quarantine that her results could still show positive up to 90 days per CDC. The letter also needs to say it is okay for her to return to work and her church. She would like this to be sent to her through mychart.   Please call pt if there are any questions. [phone].

## 2019-05-21 ENCOUNTER — Encounter: Payer: Self-pay | Admitting: Family Medicine

## 2019-05-21 LAB — NOVEL CORONAVIRUS, NAA

## 2019-05-21 NOTE — Telephone Encounter (Signed)
Called patient regarding her request for letters to return to work, school, and church activities after completing a 10-day quarantine due to a positive Covid test.  She remained asymptomatic during this time.  Letters were printed and left upfront for her to pick up on 05/21/2019.

## 2019-09-02 ENCOUNTER — Other Ambulatory Visit: Payer: Self-pay

## 2019-09-02 ENCOUNTER — Ambulatory Visit (INDEPENDENT_AMBULATORY_CARE_PROVIDER_SITE_OTHER): Payer: Self-pay | Admitting: Family Medicine

## 2019-09-02 ENCOUNTER — Other Ambulatory Visit (HOSPITAL_COMMUNITY)
Admission: RE | Admit: 2019-09-02 | Discharge: 2019-09-02 | Disposition: A | Discharge status: Home / Self Care | Payer: Managed Care, Other (non HMO) | Source: Ambulatory Visit | Attending: Family Medicine | Admitting: Family Medicine

## 2019-09-02 ENCOUNTER — Encounter: Payer: Self-pay | Admitting: Family Medicine

## 2019-09-02 VITALS — BP 108/70 | HR 79 | Ht 60.0 in | Wt 173.0 lb

## 2019-09-02 DIAGNOSIS — N898 Other specified noninflammatory disorders of vagina: Secondary | ICD-10-CM | POA: Insufficient documentation

## 2019-09-02 DIAGNOSIS — Z124 Encounter for screening for malignant neoplasm of cervix: Secondary | ICD-10-CM

## 2019-09-02 DIAGNOSIS — B379 Candidiasis, unspecified: Secondary | ICD-10-CM

## 2019-09-02 DIAGNOSIS — B3731 Acute candidiasis of vulva and vagina: Secondary | ICD-10-CM

## 2019-09-02 DIAGNOSIS — Z1159 Encounter for screening for other viral diseases: Secondary | ICD-10-CM

## 2019-09-02 DIAGNOSIS — R87612 Low grade squamous intraepithelial lesion on cytologic smear of cervix (LGSIL): Secondary | ICD-10-CM | POA: Diagnosis not present

## 2019-09-02 DIAGNOSIS — B373 Candidiasis of vulva and vagina: Secondary | ICD-10-CM

## 2019-09-02 LAB — POCT WET PREP (WET MOUNT)
Clue Cells Wet Prep Whiff POC: NEGATIVE
Trichomonas Wet Prep HPF POC: ABSENT

## 2019-09-02 LAB — POCT URINE PREGNANCY: Preg Test, Ur: NEGATIVE

## 2019-09-02 MED ORDER — MONISTAT 1 COMBO PACK 1200 & 2 MG & % VA KIT: 1.0000 | PACK | Freq: Once | VAGINAL | 0 refills | Status: DC

## 2019-09-02 MED ORDER — FLUCONAZOLE 150 MG PO TABS
150.0000 mg | ORAL_TABLET | Freq: Once | ORAL | 0 refills | Status: AC
Start: 2019-09-02 — End: 2019-09-02

## 2019-09-02 MED ORDER — TERCONAZOLE 0.4 % VA CREA: 1.0000 | TOPICAL_CREAM | Freq: Every day | VAGINAL | 0 refills | Status: DC

## 2019-09-02 NOTE — Patient Instructions (Signed)
Vaginal Yeast Infection, Adult  Vaginal yeast infection is a condition that causes vaginal discharge as well as soreness, swelling, and redness (inflammation) of the vagina. This is a common condition. Some women get this infection frequently. What are the causes? This condition is caused by a change in the normal balance of the yeast (candida) and bacteria that live in the vagina. This change causes an overgrowth of yeast, which causes the inflammation. What increases the risk? The condition is more likely to develop in women who:  Take antibiotic medicines.  Have diabetes.  Take birth control pills.  Are pregnant.  Douche often.  Have a weak body defense system (immune system).  Have been taking steroid medicines for a long time.  Frequently wear tight clothing. What are the signs or symptoms? Symptoms of this condition include:  White, thick, creamy vaginal discharge.  Swelling, itching, redness, and irritation of the vagina. The lips of the vagina (vulva) may be affected as well.  Pain or a burning feeling while urinating.  Pain during sex. How is this diagnosed? This condition is diagnosed based on:  Your medical history.  A physical exam.  A pelvic exam. Your health care provider will examine a sample of your vaginal discharge under a microscope. Your health care provider may send this sample for testing to confirm the diagnosis. How is this treated? This condition is treated with medicine. Medicines may be over-the-counter or prescription. You may be told to use one or more of the following:  Medicine that is taken by mouth (orally).  Medicine that is applied as a cream (topically).  Medicine that is inserted directly into the vagina (suppository). Follow these instructions at home:  Lifestyle  Do not have sex until your health care provider approves. Tell your sex partner that you have a yeast infection. That person should go to his or her health care  provider and ask if they should also be treated.  Do not wear tight clothes, such as pantyhose or tight pants.  Wear breathable cotton underwear. General instructions  Take or apply over-the-counter and prescription medicines only as told by your health care provider.  Eat more yogurt. This may help to keep your yeast infection from returning.  Do not use tampons until your health care provider approves.  Try taking a sitz bath to help with discomfort. This is a warm water bath that is taken while you are sitting down. The water should only come up to your hips and should cover your buttocks. Do this 3-4 times per day or as told by your health care provider.  Do not douche.  If you have diabetes, keep your blood sugar levels under control.  Keep all follow-up visits as told by your health care provider. This is important. Contact a health care provider if:  You have a fever.  Your symptoms go away and then return.  Your symptoms do not get better with treatment.  Your symptoms get worse.  You have new symptoms.  You develop blisters in or around your vagina.  You have blood coming from your vagina and it is not your menstrual period.  You develop pain in your abdomen. Summary  Vaginal yeast infection is a condition that causes discharge as well as soreness, swelling, and redness (inflammation) of the vagina.  This condition is treated with medicine. Medicines may be over-the-counter or prescription.  Take or apply over-the-counter and prescription medicines only as told by your health care provider.  Do not douche.   Do not have sex or use tampons until your health care provider approves.  Contact a health care provider if your symptoms do not get better with treatment or your symptoms go away and then return. This information is not intended to replace advice given to you by your health care provider. Make sure you discuss any questions you have with your health care  provider. Document Revised: 09/28/2018 Document Reviewed: 07/17/2017 Elsevier Patient Education  2020 Elsevier Inc.  

## 2019-09-02 NOTE — Progress Notes (Addendum)
    SUBJECTIVE:   CHIEF COMPLAINT / HPI:   VAGINAL DISCHARGE  Having vaginal discharge for 4 days. Discharge consistency: clumpy Discharge color: white Medications tried: nothing  Recent antibiotic use: n Sex in last month: Y Possible STD exposure:N, endorses using condoms every time  Symptoms Fever: N Dysuria:N Vaginal bleeding: n Abdomen or Pelvic pain: N Back pain: N Genital sores or ulcers:N Rash: N Pain during sex: N Missed menstrual period: N  ROS see HPI Smoking Status noted   OBJECTIVE:   BP 108/70   Pulse 79   Ht 5' (1.524 m)   Wt 173 lb (78.5 kg)   LMP 08/11/2019 (Exact Date)   SpO2 98%   BMI 33.79 kg/m   Normal: No acute distress Abdomen: No abdominal tenderness GU: No lesions, no CMT, no adnexal mass palpated Chaperoned by Chase Picket  ASSESSMENT/PLAN:   Yeast infection As seen on wet prep.  Urine pregnancy negative. -Diflucan   GC chlamydia pending Also did Pap as this was nearly due  Garnette Gunner, MD [facility]

## 2019-09-02 NOTE — Assessment & Plan Note (Signed)
As seen on wet prep.  Urine pregnancy negative. -Diflucan

## 2019-09-03 LAB — HEPATITIS C ANTIBODY: Hep C Virus Ab: <0.1 s/co ratio (ref 0.0–0.9)

## 2019-09-04 ENCOUNTER — Encounter: Payer: Self-pay | Admitting: Family Medicine

## 2019-09-04 LAB — CYTOLOGY - PAP
Chlamydia: NEGATIVE
Comment: NEGATIVE
Comment: NORMAL
Neisseria Gonorrhea: NEGATIVE

## 2019-09-26 DIAGNOSIS — Z0000 Encounter for general adult medical examination without abnormal findings: Secondary | ICD-10-CM | POA: Diagnosis not present

## 2019-09-26 DIAGNOSIS — B009 Herpesviral infection, unspecified: Secondary | ICD-10-CM | POA: Diagnosis not present

## 2019-09-26 DIAGNOSIS — Z7689 Persons encountering health services in other specified circumstances: Secondary | ICD-10-CM | POA: Diagnosis not present

## 2019-09-26 DIAGNOSIS — N62 Hypertrophy of breast: Secondary | ICD-10-CM | POA: Diagnosis not present

## 2019-11-06 DIAGNOSIS — N62 Hypertrophy of breast: Secondary | ICD-10-CM | POA: Diagnosis not present

## 2019-11-06 DIAGNOSIS — R21 Rash and other nonspecific skin eruption: Secondary | ICD-10-CM | POA: Diagnosis not present

## 2019-11-06 DIAGNOSIS — M542 Cervicalgia: Secondary | ICD-10-CM | POA: Diagnosis not present

## 2019-11-06 DIAGNOSIS — R238 Other skin changes: Secondary | ICD-10-CM | POA: Diagnosis not present

## 2019-11-20 ENCOUNTER — Inpatient Hospital Stay (HOSPITAL_COMMUNITY): Admit: 2019-11-20 | Payer: BLUE CROSS/BLUE SHIELD

## 2019-11-20 ENCOUNTER — Ambulatory Visit (INDEPENDENT_AMBULATORY_CARE_PROVIDER_SITE_OTHER): Payer: BLUE CROSS/BLUE SHIELD | Admitting: Family Medicine

## 2019-11-20 ENCOUNTER — Encounter: Payer: Self-pay | Admitting: Family Medicine

## 2019-11-20 ENCOUNTER — Other Ambulatory Visit: Payer: Self-pay

## 2019-11-20 VITALS — BP 112/78 | HR 83 | Ht 60.0 in | Wt 171.4 lb

## 2019-11-20 DIAGNOSIS — B373 Candidiasis of vulva and vagina: Secondary | ICD-10-CM

## 2019-11-20 DIAGNOSIS — B379 Candidiasis, unspecified: Secondary | ICD-10-CM | POA: Diagnosis not present

## 2019-11-20 DIAGNOSIS — Z113 Encounter for screening for infections with a predominantly sexual mode of transmission: Secondary | ICD-10-CM | POA: Diagnosis not present

## 2019-11-20 DIAGNOSIS — B3731 Acute candidiasis of vulva and vagina: Secondary | ICD-10-CM

## 2019-11-20 LAB — POCT WET PREP (WET MOUNT)
Clue Cells Wet Prep Whiff POC: NEGATIVE
Trichomonas Wet Prep HPF POC: ABSENT

## 2019-11-20 MED ORDER — FLUCONAZOLE 150 MG PO TABS
150.0000 mg | ORAL_TABLET | Freq: Once | ORAL | 0 refills | Status: AC
Start: 2019-11-20 — End: 2019-11-20

## 2019-11-20 NOTE — Patient Instructions (Addendum)
Alnisa,  it was great to see you today we did some swabs from the vagina to check for yeast infection and STDs.  I will be in touch with the results I have prescribed you some Diflucan anyway given that it is likely that you have a yeast infection based on your symptoms. Please follow-up with me for a discussion about birth control options.  Best wishes,  Dr. Allena Katz

## 2019-11-20 NOTE — Progress Notes (Addendum)
° ° °  SUBJECTIVE:   CHIEF COMPLAINT / HPI:   Julie Michael is a 24 year old female presents today for yeast infection concerns.  Yeast infection  Patient endorses 4-day history of cottage cheeselike vaginal discharge.  Denies foul-smelling discharge, dysuria, frequency. LMP was 10th August.  Used to be irregular but now regular. Sexually active with 1 female partner who is presumably faithful.  Has not changed female partner since previous visit.  He is not on regular birth control.  Does use condoms sometimes.  Is not trying to get pregnant.  Would like a discussion about birth control in the future.  PERTINENT  PMH / PSH: Sinusitis, genital warts, BV, vaginal candiasis  OBJECTIVE:   BP 112/78    Pulse 83    Ht 5' (1.524 m)    Wt 171 lb 6.4 oz (77.7 kg)    LMP 10/22/2019    SpO2 98%    BMI 33.47 kg/m    General: Alert, no acute distress, pleasant Cardio: Well-perfused Pulm: Normal respiratory effort Abdomen: Bowel sounds normal. Abdomen soft and non-tender. . Neuro: Cranial nerves grossly intact  Pelvic exam chaperoned by RN Judithann Sheen prep and STD swabs taken.  No cervical motion tenderness  ASSESSMENT/PLAN:   Vagina, candidiasis Wet prep positive for candida. 150mg  Diflucan sent to pharmacy and informed patient.   Screen for STD (sexually transmitted disease) Obtained HIV, RPR labs., results pending. Gc Chlamydia obtained which werel negative. Informed patient of results.  Contraception Pt is sexually active, uses condom sometimes. She is open for a discussion on birth control but would like to come back for separate clinic visit for this. Safe sex counseling provided.     , MD PGY-2 [facility]

## 2019-11-21 ENCOUNTER — Telehealth: Payer: Self-pay | Admitting: Family Medicine

## 2019-11-21 LAB — CERVICOVAGINAL ANCILLARY ONLY
Chlamydia: NEGATIVE
Comment: NEGATIVE
Comment: NEGATIVE
Comment: NORMAL
Neisseria Gonorrhea: NEGATIVE
Trichomonas: NEGATIVE

## 2019-11-21 NOTE — Telephone Encounter (Signed)
Called pt to inform her of her STD and wet prep testing from yesterday.

## 2019-11-24 NOTE — Assessment & Plan Note (Signed)
>>  ASSESSMENT AND PLAN FOR SCREEN FOR STD (SEXUALLY TRANSMITTED DISEASE) WRITTEN ON 11/26/2019  7:28 AM BY PATEL, POONAM, MD  Obtained HIV, RPR labs., results pending. Gc Chlamydia obtained which werel negative. Informed patient of results.

## 2019-11-24 NOTE — Assessment & Plan Note (Addendum)
Obtained HIV, RPR labs., results pending. Gc Chlamydia obtained which werel negative. Informed patient of results.

## 2019-11-24 NOTE — Assessment & Plan Note (Signed)
Wet prep positive for candida. 150mg  Diflucan sent to pharmacy and informed patient.

## 2019-11-24 NOTE — Assessment & Plan Note (Addendum)
Pt is sexually active, uses condom sometimes. She is open for a discussion on birth control but would like to come back for separate clinic visit for this. Safe sex counseling provided.

## 2020-01-23 ENCOUNTER — Encounter: Payer: Self-pay | Admitting: Family Medicine

## 2020-01-24 ENCOUNTER — Emergency Department (HOSPITAL_BASED_OUTPATIENT_CLINIC_OR_DEPARTMENT_OTHER)
Admission: EM | Admit: 2020-01-24 | Discharge: 2020-01-24 | Disposition: A | Discharge status: Home / Self Care | Payer: BLUE CROSS/BLUE SHIELD | Attending: Emergency Medicine | Admitting: Emergency Medicine

## 2020-01-24 ENCOUNTER — Encounter (HOSPITAL_BASED_OUTPATIENT_CLINIC_OR_DEPARTMENT_OTHER): Payer: Self-pay | Admitting: Emergency Medicine

## 2020-01-24 ENCOUNTER — Other Ambulatory Visit: Payer: Self-pay

## 2020-01-24 DIAGNOSIS — Z8616 Personal history of COVID-19: Secondary | ICD-10-CM | POA: Diagnosis not present

## 2020-01-24 DIAGNOSIS — J039 Acute tonsillitis, unspecified: Secondary | ICD-10-CM | POA: Insufficient documentation

## 2020-01-24 DIAGNOSIS — R070 Pain in throat: Secondary | ICD-10-CM | POA: Diagnosis not present

## 2020-01-24 LAB — HCG, SERUM, QUALITATIVE: Preg, Serum: NEGATIVE

## 2020-01-24 LAB — GROUP A STREP BY PCR: Group A Strep by PCR: NOT DETECTED

## 2020-01-24 MED ORDER — ACETAMINOPHEN 500 MG PO TABS
1000.0000 mg | ORAL_TABLET | Freq: Once | ORAL | Status: AC
  Administered 2020-01-24: 1000 mg via ORAL
  Filled 2020-01-24: qty 2

## 2020-01-24 MED ORDER — KETOROLAC TROMETHAMINE 30 MG/ML IJ SOLN
30.0000 mg | Freq: Once | INTRAMUSCULAR | Status: AC
  Administered 2020-01-24: 30 mg via INTRAVENOUS
  Filled 2020-01-24: qty 1

## 2020-01-24 MED ORDER — ONDANSETRON HCL 4 MG/2ML IJ SOLN
4.0000 mg | Freq: Once | INTRAMUSCULAR | Status: AC
  Administered 2020-01-24: 4 mg via INTRAVENOUS
  Filled 2020-01-24: qty 2

## 2020-01-24 MED ORDER — CLINDAMYCIN HCL 300 MG PO CAPS: 300.0000 mg | ORAL_CAPSULE | Freq: Three times a day (TID) | ORAL | 0 refills | Status: AC

## 2020-01-24 MED ORDER — SODIUM CHLORIDE 0.9 % IV BOLUS
1000.0000 mL | Freq: Once | INTRAVENOUS | Status: AC
  Administered 2020-01-24: 1000 mL via INTRAVENOUS

## 2020-01-24 MED ORDER — CLINDAMYCIN PHOSPHATE 600 MG/50ML IV SOLN
600.0000 mg | Freq: Once | INTRAVENOUS | Status: AC
  Administered 2020-01-24: 600 mg via INTRAVENOUS
  Filled 2020-01-24: qty 50

## 2020-01-24 MED ORDER — DEXAMETHASONE SODIUM PHOSPHATE 10 MG/ML IJ SOLN
10.0000 mg | Freq: Once | INTRAMUSCULAR | Status: AC
  Administered 2020-01-24: 10 mg via INTRAVENOUS
  Filled 2020-01-24: qty 1

## 2020-01-24 MED ORDER — LIDOCAINE VISCOUS HCL 2 % MT SOLN
15.0000 mL | Freq: Once | OROMUCOSAL | Status: AC
  Administered 2020-01-24: 15 mL via OROMUCOSAL
  Filled 2020-01-24: qty 15

## 2020-01-24 NOTE — ED Notes (Signed)
Pt given po ginger ale and graham crackers

## 2020-01-24 NOTE — ED Triage Notes (Signed)
Pt states she has had sore throat since yesterday and hx of tonsil stones. She states she usually takes them out but was unable to today. Denies fever, SOB. States it is painful to swallow

## 2020-01-24 NOTE — ED Provider Notes (Signed)
[facility] EMERGENCY DEPARTMENT Provider Note   CSN: [id number] Arrival date & time: 01/24/20  1123     History Chief Complaint  Patient presents with  . Sore Throat    Julie Michael is a 24 y.o. female with a past medical history significant for tonsil stones who presents for evaluation of sore throat.  Patient states she has had sore throat x2 days.  States this does not feel similar to her prior episodes of tonsil stones.  Has pain with swallowing.  She denies any drooling, dysphagia or trismus.  Has had chills however has not taken her temperature.  No emesis, neck pain, neck stiffness, congestion, rhinorrhea, cough, chest pain, shortness of breath.  Denies additional aggravating or alleviating factors.  Has not take anything for her symptoms.  History obtained from patient and past medical records.  No interpreter used.  HPI     History reviewed. No pertinent past medical history.  Patient Active Problem List   Diagnosis Date Noted  . BV (bacterial vaginosis) 12/21/2018  . Vagina, candidiasis 12/21/2018  . Genital lesion, female 12/21/2018  . Exposure to COVID-19 virus 09/11/2018  . Strain of abdominal muscle 03/30/2018  . Yeast infection 03/30/2018  . Healthcare maintenance 07/28/2017  . Screen for STD (sexually transmitted disease) 07/28/2017  . Discharge from the vagina 09/08/2016  . Routine screening for STI (sexually transmitted infection) 09/08/2016  . Contraception 09/08/2016  . Amenorrhea 04/13/2016  . Genital warts 01/23/2015  . Sinusitis, chronic 08/09/2013  . VISUAL ACUITY, DECREASED, LEFT EYE 05/26/2009    History reviewed. No pertinent surgical history.   OB History   No obstetric history on file.     No family history on file.  Social History   Tobacco Use  . Smoking status: Never Smoker  . Smokeless tobacco: Never Used  Substance Use Topics  . Alcohol use: No  . Drug use: No    Home Medications Prior to Admission  medications   Medication Sig Start Date End Date Taking? Authorizing Provider  clindamycin (CLEOCIN) 300 MG capsule Take 1 capsule (300 mg total) by mouth 3 (three) times daily for 7 days. 01/24/20 01/31/20  Shanard Treto A, PA-C  terconazole (TERAZOL 7) 0.4 % vaginal cream Place 1 applicator vaginally at bedtime. 09/02/19   Garnette Gunner, MD    Allergies    Patient has no known allergies.  Review of Systems   Review of Systems  Constitutional: Positive for chills. Negative for diaphoresis, fatigue and fever.  HENT: Positive for sore throat. Negative for congestion, dental problem, facial swelling, hearing loss, mouth sores, nosebleeds, postnasal drip, rhinorrhea, sinus pressure, sinus pain, sneezing, tinnitus, trouble swallowing and voice change.   Respiratory: Negative.   Cardiovascular: Negative.   Gastrointestinal: Negative.   Genitourinary: Negative.   Musculoskeletal: Negative.   Skin: Negative.   Neurological: Negative.   All other systems reviewed and are negative.   Physical Exam Updated Vital Signs BP 112/80   Pulse 92   Temp 99.2 F (37.3 C) (Oral)   Resp 14   Ht 5' (1.524 m)   Wt 75.3 kg   SpO2 100%   BMI 32.42 kg/m   Physical Exam Vitals and nursing note reviewed.  Constitutional:      General: She is not in acute distress.    Appearance: She is well-developed. She is obese. She is not ill-appearing or toxic-appearing.  HENT:     Head: Normocephalic and atraumatic.     Jaw: There is normal  jaw occlusion.     Nose: No congestion or rhinorrhea.     Mouth/Throat:     Lips: Pink. No lesions.     Mouth: Mucous membranes are moist.     Tongue: No lesions. Tongue does not deviate from midline.     Palate: No mass and lesions.     Pharynx: Oropharynx is clear. Posterior oropharyngeal erythema present.     Tonsils: Tonsillar exudate present. 3+ on the right. 2+ on the left.     Comments: 2+ tonsil on left, 3+ tonsil on the right.  Very minimal uvular  deviation to left.  She has no drooling, dysphagia or trismus.  Bilateral tonsils erythematous with exudate Eyes:     Pupils: Pupils are equal, round, and reactive to light.  Neck:     Trachea: Trachea and phonation normal.     Comments: No neck stiffness or neck rigidity Cardiovascular:     Rate and Rhythm: Normal rate.  Pulmonary:     Effort: No respiratory distress.  Abdominal:     General: There is no distension.  Musculoskeletal:        General: Normal range of motion.     Cervical back: Full passive range of motion without pain and normal range of motion.     Comments: Moves all 4 extremities without difficulty  Lymphadenopathy:     Cervical: Cervical adenopathy present.  Skin:    General: Skin is warm and dry.     Capillary Refill: Capillary refill takes less than 2 seconds.     Comments: No edema, erythema or warmth.  No facial swelling.  Neurological:     Mental Status: She is alert.     Cranial Nerves: Cranial nerves are intact.     Sensory: Sensation is intact.     Motor: Motor function is intact.    ED Results / Procedures / Treatments   Labs (all labs ordered are listed, but only abnormal results are displayed) Labs Reviewed  GROUP A STREP BY PCR  HCG, SERUM, QUALITATIVE    EKG None  Radiology No results found.  Procedures Procedures (including critical care time)  Medications Ordered in ED Medications  sodium chloride 0.9 % bolus 1,000 mL (1,000 mLs Intravenous New Bag/Given 01/24/20 1321)  ondansetron (ZOFRAN) injection 4 mg (4 mg Intravenous Given 01/24/20 1319)  clindamycin (CLEOCIN) IVPB 600 mg (0 mg Intravenous Stopped 01/24/20 1407)  dexamethasone (DECADRON) injection 10 mg (10 mg Intravenous Given 01/24/20 1320)  acetaminophen (TYLENOL) tablet 1,000 mg (1,000 mg Oral Given 01/24/20 1335)  ketorolac (TORADOL) 30 MG/ML injection 30 mg (30 mg Intravenous Given 01/24/20 1408)  lidocaine (XYLOCAINE) 2 % viscous mouth solution 15 mL (15 mLs  Mouth/Throat Given 01/24/20 1422)   ED Course  I have reviewed the triage vital signs and the nursing notes.  Pertinent labs & imaging results that were available during my care of the patient were reviewed by me and considered in my medical decision making (see chart for details).  24 year old presents for evaluation of sore throat.  She is afebrile, nonseptic, not ill-appearing.  No congestion, rhinorrhea, cough.  Low suspicion for Covid infection.  Patient does have erythematous tonsils bilaterally with exudate.  Possible asymmetrical enlargement with right tonsil slightly larger than left.  Very minimal really deviation to left.  She has no drooling, dysphagia or trismus.  Tolerating p.o. intake.  Patient with possible early developing peritonsillar abscess.  Will be given fluids, IV antibiotics, anti-inflammatories reassess.  Labs and imaging personally  reviewed:  Strep negative Preg negative  Patient reassessed. Tolerating PO intake without difficulty. Exam consistent with tonsillitis with possible early developing RIGHT PTA. Given Toradol, Decadron, Iv ABX. Will have her follow up with ENT. Discussed with patient. Agreeable for follow up. Low suspicion for RPA, airway compromise at this time. Symptoms significantly improved here in ED.  The patient has been appropriately medically screened and/or stabilized in the ED. I have low suspicion for any other emergent medical condition which would require further screening, evaluation or treatment in the ED or require inpatient management.  Patient is hemodynamically stable and in no acute distress.  Patient able to ambulate in department prior to ED.  Evaluation does not show acute pathology that would require ongoing or additional emergent interventions while in the emergency department or further inpatient treatment.  I have discussed the diagnosis with the patient and answered all questions.  Pain is been managed while in the emergency  department and patient has no further complaints prior to discharge.  Patient is comfortable with plan discussed in room and is stable for discharge at this time.  I have discussed strict return precautions for returning to the emergency department.  Patient was encouraged to follow-up with PCP/specialist refer to at discharge.     MDM Rules/Calculators/A&P                          Moana Munford was evaluated in Emergency Department on 01/24/2020 for the symptoms described in the history of present illness. She was evaluated in the context of the global COVID-19 pandemic, which necessitated consideration that the patient might be at risk for infection with the SARS-CoV-2 virus that causes COVID-19. Institutional protocols and algorithms that pertain to the evaluation of patients at risk for COVID-19 are in a state of rapid change based on information released by regulatory bodies including the CDC and federal and state organizations. These policies and algorithms were followed during the patient's care in the ED. Final Clinical Impression(s) / ED Diagnoses Final diagnoses:  Tonsillitis    Rx / DC Orders ED Discharge Orders         Ordered    clindamycin (CLEOCIN) 300 MG capsule  3 times daily        01/24/20 1418           Atalaya Zappia A, PA-C 01/24/20 1425    Benjiman Core, MD 01/24/20 1523

## 2020-01-24 NOTE — Discharge Instructions (Signed)
Your exam is consistent with tonsillitis with a possible developing abscess.   Take the antibiotics as prescribed  Return for new or worsening symptoms  Follow up with the Ear nose or throat providers for reevaluation at the beginning of next week. Return for new or worsening symptoms

## 2020-01-24 NOTE — ED Notes (Signed)
Ambulated to bathroom with steady gait

## 2020-04-17 ENCOUNTER — Other Ambulatory Visit: Payer: Self-pay

## 2020-04-17 ENCOUNTER — Encounter (HOSPITAL_BASED_OUTPATIENT_CLINIC_OR_DEPARTMENT_OTHER): Payer: Self-pay | Admitting: *Deleted

## 2020-04-17 ENCOUNTER — Emergency Department (HOSPITAL_BASED_OUTPATIENT_CLINIC_OR_DEPARTMENT_OTHER)
Admission: EM | Admit: 2020-04-17 | Discharge: 2020-04-17 | Disposition: A | Discharge status: Home / Self Care | Payer: BLUE CROSS/BLUE SHIELD | Attending: Emergency Medicine | Admitting: Emergency Medicine

## 2020-04-17 DIAGNOSIS — M545 Low back pain, unspecified: Secondary | ICD-10-CM | POA: Insufficient documentation

## 2020-04-17 MED ORDER — KETOROLAC TROMETHAMINE 30 MG/ML IJ SOLN
60.0000 mg | Freq: Once | INTRAMUSCULAR | Status: AC
  Administered 2020-04-17: 60 mg via INTRAMUSCULAR
  Filled 2020-04-17: qty 2

## 2020-04-17 MED ORDER — METHOCARBAMOL 500 MG PO TABS: 500.0000 mg | ORAL_TABLET | Freq: Two times a day (BID) | ORAL | 0 refills | Status: DC

## 2020-04-17 MED ORDER — NAPROXEN 500 MG PO TABS: 500.0000 mg | ORAL_TABLET | Freq: Two times a day (BID) | ORAL | 0 refills | Status: DC

## 2020-04-17 NOTE — ED Provider Notes (Signed)
[facility] EMERGENCY DEPARTMENT Provider Note  CSN: [id number] Arrival date & time: 04/17/20 2024    History Chief Complaint  Patient presents with  . Back Injury    HPI  Julie Michael is a 25 y.o. female here for evaluation of diffuse lower back pain, moderate to severe, aching and worse with movement. Started after she worked out at [employer] with [employer] that are heavier than she is used to. She denies any radiation down her leg. No other red flags.    History reviewed. No pertinent past medical history.  History reviewed. No pertinent surgical history.  No family history on file.  Social History   Tobacco Use  . Smoking status: Never Smoker  . Smokeless tobacco: Never Used  Substance Use Topics  . Alcohol use: No  . Drug use: No     Home Medications Prior to Admission medications   Medication Sig Start Date End Date Taking? Authorizing Provider  methocarbamol (ROBAXIN) 500 MG tablet Take 1 tablet (500 mg total) by mouth 2 (two) times daily. 04/17/20  Yes Pollyann Savoy, MD  naproxen (NAPROSYN) 500 MG tablet Take 1 tablet (500 mg total) by mouth 2 (two) times daily. 04/17/20  Yes Pollyann Savoy, MD  terconazole (TERAZOL 7) 0.4 % vaginal cream Place 1 applicator vaginally at bedtime. 09/02/19  Yes Garnette Gunner, MD     Allergies    Patient has no known allergies.   Review of Systems   Review of Systems A comprehensive review of systems was completed and negative except as noted in HPI.    Physical Exam BP 126/77   Pulse 83   Temp 98.1 F (36.7 C) (Oral)   Resp 18   Ht 5' (1.524 m)   Wt 75.3 kg   LMP 04/15/2020   SpO2 100%   BMI 32.42 kg/m   Physical Exam Vitals and nursing note reviewed.  Constitutional:      Appearance: Normal appearance.  HENT:     Head: Normocephalic and atraumatic.     Nose: Nose normal.     Mouth/Throat:     Mouth: Mucous membranes are moist.  Eyes:     Extraocular Movements: Extraocular movements  intact.     Conjunctiva/sclera: Conjunctivae normal.  Cardiovascular:     Rate and Rhythm: Normal rate.  Pulmonary:     Effort: Pulmonary effort is normal.     Breath sounds: Normal breath sounds.  Abdominal:     General: Abdomen is flat.     Palpations: Abdomen is soft.     Tenderness: There is no abdominal tenderness.  Musculoskeletal:        General: Tenderness (diffuse lumbar paraspinal muscles, no midline tenderness) present. No swelling. Normal range of motion.     Cervical back: Neck supple.  Skin:    General: Skin is warm and dry.  Neurological:     General: No focal deficit present.     Mental Status: She is alert.  Psychiatric:        Mood and Affect: Mood normal.      ED Results / Procedures / Treatments   Labs (all labs ordered are listed, but only abnormal results are displayed) Labs Reviewed - No data to display  EKG None   Radiology No results found.  Procedures Procedures  Medications Ordered in the ED Medications  ketorolac (TORADOL) 30 MG/ML injection 60 mg (has no administration in time range)     MDM Rules/Calculators/A&P MDM IM toradol  for pain. Rx for Naprosyn, robaxin and work note for the weekend.  ED Course  I have reviewed the triage vital signs and the nursing notes.  Pertinent labs & imaging results that were available during my care of the patient were reviewed by me and considered in my medical decision making (see chart for details).     Final Clinical Impression(s) / ED Diagnoses Final diagnoses:  Acute bilateral low back pain without sciatica    Rx / DC Orders ED Discharge Orders         Ordered    naproxen (NAPROSYN) 500 MG tablet  2 times daily        04/17/20 2207    methocarbamol (ROBAXIN) 500 MG tablet  2 times daily        04/17/20 2207           Pollyann Savoy, MD 04/17/20 2207

## 2020-04-17 NOTE — ED Notes (Signed)
Pt. Encouraged to wear tennis shoes not crocks

## 2020-04-17 NOTE — ED Triage Notes (Signed)
Lower back pain since working out at the gym 2 days ago.

## 2020-06-08 ENCOUNTER — Encounter (HOSPITAL_BASED_OUTPATIENT_CLINIC_OR_DEPARTMENT_OTHER): Payer: Self-pay | Admitting: Emergency Medicine

## 2020-06-08 ENCOUNTER — Emergency Department (HOSPITAL_BASED_OUTPATIENT_CLINIC_OR_DEPARTMENT_OTHER)
Admission: EM | Admit: 2020-06-08 | Discharge: 2020-06-08 | Disposition: A | Discharge status: Home / Self Care | Payer: Self-pay | Attending: Emergency Medicine | Admitting: Emergency Medicine

## 2020-06-08 ENCOUNTER — Other Ambulatory Visit: Payer: Self-pay

## 2020-06-08 ENCOUNTER — Other Ambulatory Visit (HOSPITAL_COMMUNITY): Payer: Self-pay | Admitting: Emergency Medicine

## 2020-06-08 DIAGNOSIS — J039 Acute tonsillitis, unspecified: Secondary | ICD-10-CM | POA: Insufficient documentation

## 2020-06-08 LAB — GROUP A STREP BY PCR: Group A Strep by PCR: NOT DETECTED

## 2020-06-08 MED ORDER — AMOXICILLIN 500 MG PO CAPS: 500.0000 mg | ORAL_CAPSULE | Freq: Three times a day (TID) | ORAL | 0 refills | Status: DC

## 2020-06-08 MED ORDER — KETOROLAC TROMETHAMINE 60 MG/2ML IM SOLN
60.0000 mg | Freq: Once | INTRAMUSCULAR | Status: AC
  Administered 2020-06-08: 60 mg via INTRAMUSCULAR
  Filled 2020-06-08: qty 2

## 2020-06-08 MED ORDER — LIDOCAINE VISCOUS HCL 2 % MT SOLN
15.0000 mL | Freq: Once | OROMUCOSAL | Status: AC
  Administered 2020-06-08: 15 mL via OROMUCOSAL
  Filled 2020-06-08: qty 15

## 2020-06-08 MED FILL — AMOXICILLIN 500 MG CAPSULE: 500 | 7 days supply | Qty: 21 | Fill #0

## 2020-06-08 NOTE — ED Triage Notes (Signed)
Sore throat since sat  , has hx of tonsillitis , hurts to swallow and talk , OTC  meds not helping

## 2020-06-08 NOTE — ED Provider Notes (Signed)
[facility] EMERGENCY DEPARTMENT Provider Note   CSN: [id number] Arrival date & time: 06/08/20  8546     History Chief Complaint  Patient presents with  . Sore Throat    Julie Michael is a 25 y.o. female.  The history is provided by the patient.  Sore Throat This is a recurrent problem. The current episode started 2 days ago. The problem occurs constantly. The problem has been gradually worsening. Associated symptoms comments: Sore throat most prominent on the right side.  Painful swallowing.  No fever, cough minimal sinus drainage.  Prior history of strep throat last diagnosed and treated in November 2021.  Patient denies any shortness of breath, chest pain, abdominal pain or vomiting.. Exacerbated by: Swallowing, talking. Nothing relieves the symptoms. Treatments tried: OTC meds. The treatment provided no relief.       History reviewed. No pertinent past medical history.  Patient Active Problem List   Diagnosis Date Noted  . BV (bacterial vaginosis) 12/21/2018  . Vagina, candidiasis 12/21/2018  . Genital lesion, female 12/21/2018  . Exposure to COVID-19 virus 09/11/2018  . Strain of abdominal muscle 03/30/2018  . Yeast infection 03/30/2018  . Healthcare maintenance 07/28/2017  . Screen for STD (sexually transmitted disease) 07/28/2017  . Discharge from the vagina 09/08/2016  . Routine screening for STI (sexually transmitted infection) 09/08/2016  . Contraception 09/08/2016  . Amenorrhea 04/13/2016  . Genital warts 01/23/2015  . Sinusitis, chronic 08/09/2013  . VISUAL ACUITY, DECREASED, LEFT EYE 05/26/2009    No past surgical history on file.   OB History   No obstetric history on file.     No family history on file.  Social History   Tobacco Use  . Smoking status: Never Smoker  . Smokeless tobacco: Never Used  Substance Use Topics  . Alcohol use: No  . Drug use: No    Home Medications Prior to Admission medications   Medication Sig Start  Date End Date Taking? Authorizing Provider  methocarbamol (ROBAXIN) 500 MG tablet Take 1 tablet (500 mg total) by mouth 2 (two) times daily. 04/17/20   Pollyann Savoy, MD  naproxen (NAPROSYN) 500 MG tablet Take 1 tablet (500 mg total) by mouth 2 (two) times daily. 04/17/20   Pollyann Savoy, MD  terconazole (TERAZOL 7) 0.4 % vaginal cream Place 1 applicator vaginally at bedtime. 09/02/19   Garnette Gunner, MD    Allergies    Patient has no known allergies.  Review of Systems   Review of Systems  All other systems reviewed and are negative.   Physical Exam Updated Vital Signs BP 122/69 (BP Location: Right Arm)   Pulse 84   Temp 98.7 F (37.1 C)   Resp 18   Ht 5' (1.524 m)   Wt 76.2 kg   SpO2 100%   BMI 32.81 kg/m   Physical Exam Vitals and nursing note reviewed.  Constitutional:      General: She is not in acute distress.    Appearance: She is well-developed.  HENT:     Head: Normocephalic and atraumatic.     Right Ear: Tympanic membrane normal.     Left Ear: Tympanic membrane normal.     Ears:     Comments: Partial occlusion bilateral ear canals from cerumen    Nose: Congestion present.     Mouth/Throat:     Mouth: Mucous membranes are moist.     Pharynx: Pharyngeal swelling, oropharyngeal exudate and posterior oropharyngeal erythema present.  Tonsils: Tonsillar exudate present. 3+ on the right. 1+ on the left.  Eyes:     Pupils: Pupils are equal, round, and reactive to light.  Cardiovascular:     Rate and Rhythm: Normal rate and regular rhythm.     Heart sounds: Normal heart sounds. No murmur heard. No friction rub.  Pulmonary:     Effort: Pulmonary effort is normal.     Breath sounds: Normal breath sounds. No wheezing or rales.  Musculoskeletal:        General: Normal range of motion.     Comments: No edema  Lymphadenopathy:     Cervical: Cervical adenopathy present.  Skin:    General: Skin is warm and dry.     Findings: No rash.  Neurological:      Mental Status: She is alert and oriented to person, place, and time. Mental status is at baseline.  Psychiatric:        Behavior: Behavior normal.     ED Results / Procedures / Treatments   Labs (all labs ordered are listed, but only abnormal results are displayed) Labs Reviewed  GROUP A STREP BY PCR    EKG None  Radiology No results found.  Procedures Procedures   Medications Ordered in ED Medications - No data to display  ED Course  I have reviewed the triage vital signs and the nursing notes.  Pertinent labs & imaging results that were available during my care of the patient were reviewed by me and considered in my medical decision making (see chart for details).    MDM Rules/Calculators/A&P                          Patient presenting today with prior history of strep tonsillitis with symptoms for the last 2 days and worsening pain.  No evidence of peritonsillar abscess today, low suspicion for RPA, epiglottitis however on exam patient does appear to have tonsillitis with swelling and exudate most prominently on the right tonsil but small amount on the left.  Strep PCR pending.  8:27 AM Strep is neg. discussed with patient watchful waiting.  She was given a prescription for antibiotic but encouraged to wait 2 or 3 days as this could very well be viral and should improve spontaneously.  Patient encouraged to use benzocaine, Tylenol and ibuprofen.  Final Clinical Impression(s) / ED Diagnoses Final diagnoses:  Tonsillitis    Rx / DC Orders ED Discharge Orders         Ordered    amoxicillin (AMOXIL) 500 MG capsule  3 times daily        06/08/20 9371           Gwyneth Sprout, MD 06/08/20 0830

## 2020-06-08 NOTE — Discharge Instructions (Signed)
The strep test was negative today.  This could be a viral tonsillitis in which case it should start getting better on its own in the next 2 to 3 days.  You were given an antibiotic but encourage waiting to fill or use the antibiotic unless you are not improving in the next few days.  Use benzocaine spray or throat lozenges as well as Tylenol and ibuprofen to help with the pain.  Make sure you are drinking plenty of fluids.

## 2020-07-24 ENCOUNTER — Encounter (HOSPITAL_BASED_OUTPATIENT_CLINIC_OR_DEPARTMENT_OTHER): Payer: Self-pay | Admitting: *Deleted

## 2020-07-24 ENCOUNTER — Other Ambulatory Visit: Payer: Self-pay

## 2020-07-24 ENCOUNTER — Emergency Department (HOSPITAL_BASED_OUTPATIENT_CLINIC_OR_DEPARTMENT_OTHER)
Admission: EM | Admit: 2020-07-24 | Discharge: 2020-07-24 | Disposition: A | Discharge status: Home / Self Care | Payer: Self-pay | Attending: Emergency Medicine | Admitting: Emergency Medicine

## 2020-07-24 DIAGNOSIS — L739 Follicular disorder, unspecified: Secondary | ICD-10-CM | POA: Insufficient documentation

## 2020-07-24 MED ORDER — DOXYCYCLINE HYCLATE 100 MG PO CAPS: 100.0000 mg | ORAL_CAPSULE | Freq: Two times a day (BID) | ORAL | 0 refills | Status: DC

## 2020-07-24 MED ORDER — MUPIROCIN CALCIUM 2 % EX CREA: 1.0000 "application " | TOPICAL_CREAM | Freq: Two times a day (BID) | CUTANEOUS | 0 refills | Status: DC

## 2020-07-24 NOTE — ED Provider Notes (Signed)
[facility] EMERGENCY DEPARTMENT Provider Note   CSN: [id number] Arrival date & time: 07/24/20  1848     History Chief Complaint  Patient presents with  . Abscess    Julie Michael is a 25 y.o. female.  Patient presents emergency department for evaluation of skin irritation starting after having a [place] wax done 3 days ago.  Patient noted some burning to the skin yesterday.  She feels mild amount of swelling.  No drainage.  No difficulty with urination with having bowel movements.  No fevers, nausea or vomiting. The onset of this condition was acute. The course is constant. Aggravating factors: none. Alleviating factors: none.          History reviewed. No pertinent past medical history.  Patient Active Problem List   Diagnosis Date Noted  . BV (bacterial vaginosis) 12/21/2018  . Vagina, candidiasis 12/21/2018  . Genital lesion, female 12/21/2018  . Exposure to COVID-19 virus 09/11/2018  . Strain of abdominal muscle 03/30/2018  . Yeast infection 03/30/2018  . Healthcare maintenance 07/28/2017  . Screen for STD (sexually transmitted disease) 07/28/2017  . Discharge from the vagina 09/08/2016  . Routine screening for STI (sexually transmitted infection) 09/08/2016  . Contraception 09/08/2016  . Amenorrhea 04/13/2016  . Genital warts 01/23/2015  . Sinusitis, chronic 08/09/2013  . VISUAL ACUITY, DECREASED, LEFT EYE 05/26/2009    History reviewed. No pertinent surgical history.   OB History   No obstetric history on file.     No family history on file.  Social History   Tobacco Use  . Smoking status: Never Smoker  . Smokeless tobacco: Never Used  Substance Use Topics  . Alcohol use: No  . Drug use: No    Home Medications Prior to Admission medications   Medication Sig Start Date End Date Taking? Authorizing Provider  doxycycline (VIBRAMYCIN) 100 MG capsule Take 1 capsule (100 mg total) by mouth 2 (two) times daily. 07/24/20  Yes Renne Crigler, PA-C  mupirocin cream (BACTROBAN) 2 % Apply 1 application topically 2 (two) times daily. 07/24/20  Yes Renne Crigler, PA-C  terconazole (TERAZOL 7) 0.4 % vaginal cream Place 1 applicator vaginally at bedtime. 09/02/19   Garnette Gunner, MD    Allergies    Patient has no known allergies.  Review of Systems   Review of Systems  Constitutional: Negative for fever.  Gastrointestinal: Negative for nausea and vomiting.  Genitourinary: Negative for dysuria.  Skin: Positive for rash. Negative for wound.    Physical Exam Updated Vital Signs BP 112/77 (BP Location: Right Arm)   Pulse 87   Temp 97.7 F (36.5 C) (Oral)   Resp 18   Ht 5' (1.524 m)   Wt 76.2 kg   LMP 07/13/2020   SpO2 99%   BMI 32.81 kg/m   Physical Exam Vitals and nursing note reviewed. Exam conducted with a chaperone present.  Constitutional:      Appearance: She is well-developed.  HENT:     Head: Normocephalic and atraumatic.  Eyes:     Conjunctiva/sclera: Conjunctivae normal.  Pulmonary:     Effort: No respiratory distress.  Genitourinary:      Comments: Mild skin irritation/erythema noted. Suspect folliculitis, no sign of abscess at this time.  Musculoskeletal:     Cervical back: Normal range of motion and neck supple.  Skin:    General: Skin is warm and dry.  Neurological:     Mental Status: She is alert.     ED Results /  Procedures / Treatments   Labs (all labs ordered are listed, but only abnormal results are displayed) Labs Reviewed - No data to display  EKG None  Radiology No results found.  Procedures Procedures   Medications Ordered in ED Medications - No data to display  ED Course  I have reviewed the triage vital signs and the nursing notes.  Pertinent labs & imaging results that were available during my care of the patient were reviewed by me and considered in my medical decision making (see chart for details).  Patient seen and examined.  Exam performed with  female RN chaperone.  Vital signs reviewed and are as follows: BP 112/77 (BP Location: Right Arm)   Pulse 87   Temp 97.7 F (36.5 C) (Oral)   Resp 18   Ht 5' (1.524 m)   Wt 76.2 kg   LMP 07/13/2020   SpO2 99%   BMI 32.81 kg/m   Plan: topical bactroban, rx doxy to cover mild/progressing cellulitis or abscess.  Certainly nothing requiring drainage tonight.  Also recommended warm soaks.  Pt urged to return with worsening pain, worsening swelling, expanding area of redness or streaking up extremity, fever, or any other concerns. Urged to take complete course of antibiotics as prescribed. Pt verbalizes understanding and agrees with plan.     MDM Rules/Calculators/A&P                          Patient with suspected folliculitis after recent wax, cellulitis/abscess less likely. Doubt herpetic lesions.    Final Clinical Impression(s) / ED Diagnoses Final diagnoses:  Folliculitis    Rx / DC Orders ED Discharge Orders         Ordered    doxycycline (VIBRAMYCIN) 100 MG capsule  2 times daily        07/24/20 1927    mupirocin cream (BACTROBAN) 2 %  2 times daily        07/24/20 1927           Renne Crigler, Cordelia Poche 07/24/20 1931    Jacalyn Lefevre, MD 07/24/20 2033

## 2020-07-24 NOTE — Discharge Instructions (Signed)
Please read and follow all provided instructions.  Your diagnoses today include:  1. Folliculitis     Tests performed today include:  Vital signs. See below for your results today.   Medications prescribed:   Doxycycline - antibiotic  You have been prescribed an antibiotic medicine: take the entire course of medicine even if you are feeling better. Stopping early can cause the antibiotic not to work.  Take any prescribed medications only as directed.   Home care instructions:  Follow any educational materials contained in this packet. Keep affected area above the level of your heart when possible. Wash area gently twice a day with warm soapy water. Do not apply alcohol or hydrogen peroxide. Cover the area if it draining or weeping.   Follow-up instructions: Return to the Emergency Department in 48 hours for a recheck if your symptoms are not significantly improved.   Please follow-up with your primary care provider in the next 1 week for further evaluation of your symptoms.   Return instructions:  Return to the Emergency Department if you have:  Fever  Worsening symptoms  Worsening pain  Worsening swelling  Redness of the skin that moves away from the affected area, especially if it streaks away from the affected area   Any other emergent concerns  Your vital signs today were: BP 112/77 (BP Location: Right Arm)   Pulse 87   Temp 97.7 F (36.5 C) (Oral)   Resp 18   Ht 5' (1.524 m)   Wt 76.2 kg   LMP 07/13/2020   SpO2 99%   BMI 32.81 kg/m  If your blood pressure (BP) was elevated above 135/85 this visit, please have this repeated by your doctor within one month. --------------

## 2020-07-24 NOTE — ED Triage Notes (Signed)
Abscess to her vaginal/buttocks region. Started a day after a [place] wax.

## 2020-07-24 NOTE — ED Notes (Signed)
ED Provider at bedside. 

## 2020-07-26 ENCOUNTER — Other Ambulatory Visit: Payer: Self-pay

## 2020-07-26 ENCOUNTER — Encounter (HOSPITAL_BASED_OUTPATIENT_CLINIC_OR_DEPARTMENT_OTHER): Payer: Self-pay | Admitting: Emergency Medicine

## 2020-07-26 ENCOUNTER — Emergency Department (HOSPITAL_BASED_OUTPATIENT_CLINIC_OR_DEPARTMENT_OTHER)
Admission: EM | Admit: 2020-07-26 | Discharge: 2020-07-26 | Disposition: A | Discharge status: Home / Self Care | Payer: Self-pay | Attending: Emergency Medicine | Admitting: Emergency Medicine

## 2020-07-26 DIAGNOSIS — R21 Rash and other nonspecific skin eruption: Secondary | ICD-10-CM

## 2020-07-26 DIAGNOSIS — Z8619 Personal history of other infectious and parasitic diseases: Secondary | ICD-10-CM | POA: Insufficient documentation

## 2020-07-26 DIAGNOSIS — K626 Ulcer of anus and rectum: Secondary | ICD-10-CM | POA: Insufficient documentation

## 2020-07-26 HISTORY — DX: Herpesviral infection, unspecified: B00.9

## 2020-07-26 MED ORDER — ONDANSETRON HCL 4 MG PO TABS: 4.0000 mg | ORAL_TABLET | Freq: Three times a day (TID) | ORAL | 0 refills | Status: DC | PRN

## 2020-07-26 MED ORDER — VALACYCLOVIR HCL 500 MG PO TABS: 500.0000 mg | ORAL_TABLET | Freq: Two times a day (BID) | ORAL | 2 refills | Status: AC

## 2020-07-26 NOTE — ED Notes (Signed)
States this past Thursday noted a rash in her genital area. ED PA in to evaluate pt

## 2020-07-26 NOTE — ED Triage Notes (Signed)
Pt reports possible herpes outbreak noticed Thursday. Pt seen here recently.

## 2020-07-26 NOTE — ED Provider Notes (Signed)
[facility] EMERGENCY DEPARTMENT Provider Note   CSN: [id number] Arrival date & time: 07/26/20  1123     History Chief Complaint  Patient presents with  . Rash    Julie Michael is a 25 y.o. female with PMHx, genital warts, herpes genitalis, presenting for evaluation of rash to genitalia.  Patient was evaluated 3 days ago in the ED.  Developed rash to her perineum after [place] wax.  Initial evaluation was most consistent with folliculitis.  She was discharged with prescription for Bactroban and doxycycline.  She states the Bactroban was too expensive, therefore she only filled the doxycycline.  It has been making her very sick on her stomach, she has been having difficulty tolerating the medication.  As she could not afford the Bactroban, she began applying a topical type fungal cream to her rash.  She states however she believes the rash is starting to appear more consistent with herpes outbreak.  States she has had herpes outbreak in the past and thinks it feels similar.  It is painful.  States the vesicles de-roofed today with wiping. No pelvic complaints, no fever, no widespread rash  The history is provided by the patient and medical records.       Past Medical History:  Diagnosis Date  . Herpes     Patient Active Problem List   Diagnosis Date Noted  . BV (bacterial vaginosis) 12/21/2018  . Vagina, candidiasis 12/21/2018  . Genital lesion, female 12/21/2018  . Exposure to COVID-19 virus 09/11/2018  . Strain of abdominal muscle 03/30/2018  . Yeast infection 03/30/2018  . Healthcare maintenance 07/28/2017  . Screen for STD (sexually transmitted disease) 07/28/2017  . Discharge from the vagina 09/08/2016  . Routine screening for STI (sexually transmitted infection) 09/08/2016  . Contraception 09/08/2016  . Amenorrhea 04/13/2016  . Genital warts 01/23/2015  . Sinusitis, chronic 08/09/2013  . VISUAL ACUITY, DECREASED, LEFT EYE 05/26/2009    History  reviewed. No pertinent surgical history.   OB History   No obstetric history on file.     No family history on file.  Social History   Tobacco Use  . Smoking status: Never Smoker  . Smokeless tobacco: Never Used  Vaping Use  . Vaping Use: Never used  Substance Use Topics  . Alcohol use: No  . Drug use: No    Home Medications Prior to Admission medications   Medication Sig Start Date End Date Taking? Authorizing Provider  ondansetron (ZOFRAN) 4 MG tablet Take 1 tablet (4 mg total) by mouth every 8 (eight) hours as needed for nausea or vomiting. 07/26/20  Yes Josephene Marrone, [place] N, PA-C  valACYclovir (VALTREX) 500 MG tablet Take 1 tablet (500 mg total) by mouth 2 (two) times daily for 3 days. 07/26/20 07/29/20 Yes Nateisha Moyd, [place] N, PA-C  doxycycline (VIBRAMYCIN) 100 MG capsule Take 1 capsule (100 mg total) by mouth 2 (two) times daily. 07/24/20   Renne Crigler, PA-C  mupirocin cream (BACTROBAN) 2 % Apply 1 application topically 2 (two) times daily. 07/24/20   Renne Crigler, PA-C  terconazole (TERAZOL 7) 0.4 % vaginal cream Place 1 applicator vaginally at bedtime. 09/02/19   Garnette Gunner, MD    Allergies    Patient has no known allergies.  Review of Systems   Review of Systems  Skin: Positive for rash.  All other systems reviewed and are negative.   Physical Exam Updated Vital Signs BP 128/74 (BP Location: Left Arm)   Pulse 79   Temp 98.4 F (  36.9 C) (Oral)   Resp 18   Ht 5' (1.524 m)   Wt 76.2 kg   LMP 07/13/2020   SpO2 98%   BMI 32.81 kg/m   Physical Exam Vitals and nursing note reviewed. Exam conducted with a chaperone present.  Constitutional:      Appearance: She is well-developed.  HENT:     Head: Normocephalic and atraumatic.  Eyes:     Conjunctiva/sclera: Conjunctivae normal.  Cardiovascular:     Rate and Rhythm: Normal rate.  Pulmonary:     Effort: Pulmonary effort is normal.  Genitourinary:    Comments: Exam performed with female RN chaperone  present. There are about 5-6 small tender ulcers on erythematous base to the perineum between the fourchette and the anus.  No purulence or fluctuance.  No other lesions noted. Neurological:     Mental Status: She is alert.  Psychiatric:        Mood and Affect: Mood normal.        Behavior: Behavior normal.     ED Results / Procedures / Treatments   Labs (all labs ordered are listed, but only abnormal results are displayed) Labs Reviewed - No data to display  EKG None  Radiology No results found.  Procedures Procedures   Medications Ordered in ED Medications - No data to display  ED Course  I have reviewed the triage vital signs and the nursing notes.  Pertinent labs & imaging results that were available during my care of the patient were reviewed by me and considered in my medical decision making (see chart for details).    MDM Rules/Calculators/A&P                          Patient presenting for reevaluation of rash to perineum.  Initially seen 3 days ago with examination most consistent with a folliculitis.  Has been treating with topical antifungal cream as she could not afford the Bactroban, as well as p.o. doxycycline.  States she has had genital herpes in the past and this is feeling more consistent with herpes outbreak.  She does have erythematous ulcers in the perineum that are tender.  No abscess or surrounding cellulitis. Offered pelvic exam, pt deferred to outpatient follow up with GYN.  We will prescribe valacyclovir, Zofran for nausea with medication. States it has been 3 years since pap smear. Encourage she follow closely with her gynecologist for reevaluation and regular screening.  Discussed results, findings, treatment and follow up. Patient advised of return precautions. Patient verbalized understanding and agreed with plan.   Final Clinical Impression(s) / ED Diagnoses Final diagnoses:  Perineal rash in female  History of herpes genitalis    Rx / DC  Orders ED Discharge Orders         Ordered    ondansetron (ZOFRAN) 4 MG tablet  Every 8 hours PRN        07/26/20 1514    valACYclovir (VALTREX) 500 MG tablet  2 times daily        07/26/20 1514           Jkayla Spiewak, [place] N, [place] 07/26/20 1516    Little, Ambrose [place], MD 07/29/20 1601

## 2020-07-26 NOTE — Discharge Instructions (Signed)
Take the zofran 30 minutes before medication. Take the valacyclovir in morning and at bedtime for 3 days. You have refills for any future outbreak- it works best if taken within 24 hours of symptom onset. Continue taking the doxycycline as directed. Avoid any sexual intercourse for while having active outbreak, as this is very contagious.   Follow closely with your gynecologist.

## 2020-10-14 ENCOUNTER — Other Ambulatory Visit (HOSPITAL_BASED_OUTPATIENT_CLINIC_OR_DEPARTMENT_OTHER): Payer: Self-pay

## 2020-10-14 ENCOUNTER — Emergency Department (HOSPITAL_BASED_OUTPATIENT_CLINIC_OR_DEPARTMENT_OTHER)
Admission: EM | Admit: 2020-10-14 | Discharge: 2020-10-14 | Disposition: A | Discharge status: Home / Self Care | Payer: Self-pay | Attending: Emergency Medicine | Admitting: Emergency Medicine

## 2020-10-14 ENCOUNTER — Encounter (HOSPITAL_BASED_OUTPATIENT_CLINIC_OR_DEPARTMENT_OTHER): Payer: Self-pay

## 2020-10-14 ENCOUNTER — Other Ambulatory Visit: Payer: Self-pay

## 2020-10-14 DIAGNOSIS — N898 Other specified noninflammatory disorders of vagina: Secondary | ICD-10-CM | POA: Diagnosis not present

## 2020-10-14 DIAGNOSIS — B373 Candidiasis of vulva and vagina: Secondary | ICD-10-CM | POA: Diagnosis not present

## 2020-10-14 DIAGNOSIS — B379 Candidiasis, unspecified: Secondary | ICD-10-CM | POA: Insufficient documentation

## 2020-10-14 HISTORY — DX: Candidiasis, unspecified: B37.9

## 2020-10-14 MED ORDER — FLUCONAZOLE 200 MG PO TABS
200.0000 mg | ORAL_TABLET | Freq: Once | ORAL | 0 refills | Status: AC
  Filled 2020-10-14: qty 1, 1d supply, fill #0

## 2020-10-14 NOTE — ED Provider Notes (Signed)
[facility] EMERGENCY DEPARTMENT Provider Note   CSN: [id number] Arrival date & time: 10/14/20  0841     History Chief Complaint  Patient presents with   Vaginal Itching    Julie Michael is a 25 y.o. female present emergency department vaginal itching concern for yeast infection.  She says she has had many yeast infections before.  This feels exactly to priors.  She reports no concern for STI exposure.  She says she is not able to make an appointment at her family physician's office, and has been having intense itching for 2 days, and is here requesting a Diflucan prescription.  No other medical concerns.  HPI     Past Medical History:  Diagnosis Date   Herpes    Yeast infection     Patient Active Problem List   Diagnosis Date Noted   BV (bacterial vaginosis) 12/21/2018   Vagina, candidiasis 12/21/2018   Genital lesion, female 12/21/2018   Exposure to COVID-19 virus 09/11/2018   Strain of abdominal muscle 03/30/2018   Yeast infection 03/30/2018   Healthcare maintenance 07/28/2017   Screen for STD (sexually transmitted disease) 07/28/2017   Discharge from the vagina 09/08/2016   Routine screening for STI (sexually transmitted infection) 09/08/2016   Contraception 09/08/2016   Amenorrhea 04/13/2016   Genital warts 01/23/2015   Sinusitis, chronic 08/09/2013   VISUAL ACUITY, DECREASED, LEFT EYE 05/26/2009    History reviewed. No pertinent surgical history.   OB History   No obstetric history on file.     History reviewed. No pertinent family history.  Social History   Tobacco Use   Smoking status: Never   Smokeless tobacco: Never  Vaping Use   Vaping Use: Never used  Substance Use Topics   Alcohol use: No   Drug use: No    Home Medications Prior to Admission medications   Medication Sig Start Date End Date Taking? Authorizing Provider  fluconazole (DIFLUCAN) 200 MG tablet Take 1 tablet (200 mg total) by mouth once for 1 dose. 10/14/20 10/15/20  Yes Akisha Sturgill, Kermit Balo, MD  doxycycline (VIBRAMYCIN) 100 MG capsule Take 1 capsule (100 mg total) by mouth 2 (two) times daily. 07/24/20   Renne Crigler, PA-C  mupirocin cream (BACTROBAN) 2 % Apply 1 application topically 2 (two) times daily. 07/24/20   Renne Crigler, PA-C  ondansetron (ZOFRAN) 4 MG tablet Take 1 tablet (4 mg total) by mouth every 8 (eight) hours as needed for nausea or vomiting. 07/26/20   Robinson, [place] N, PA-C  terconazole (TERAZOL 7) 0.4 % vaginal cream Place 1 applicator vaginally at bedtime. 09/02/19   Garnette Gunner, MD    Allergies    Patient has no known allergies.  Review of Systems   Review of Systems  Constitutional:  Negative for chills and fever.  Gastrointestinal:  Negative for abdominal pain and vomiting.  Genitourinary:  Negative for vaginal bleeding, vaginal discharge and vaginal pain.  Skin:  Negative for color change and rash.  All other systems reviewed and are negative.  Physical Exam Updated Vital Signs BP 125/78 (BP Location: Right Arm)   Pulse 77   Temp 98.6 F (37 C) (Oral)   Resp 18   Ht 5' (1.524 m)   Wt 73.9 kg   LMP 09/21/2020 (Exact Date)   SpO2 98%   BMI 31.83 kg/m   Physical Exam Constitutional:      General: She is not in acute distress. HENT:     Head: Normocephalic and atraumatic.  Eyes:  Conjunctiva/sclera: Conjunctivae normal.     Pupils: Pupils are equal, round, and reactive to light.  Cardiovascular:     Rate and Rhythm: Normal rate and regular rhythm.  Pulmonary:     Effort: Pulmonary effort is normal. No respiratory distress.  Skin:    General: Skin is warm and dry.  Neurological:     General: No focal deficit present.     Mental Status: She is alert. Mental status is at baseline.  Psychiatric:        Mood and Affect: Mood normal.        Behavior: Behavior normal.    ED Results / Procedures / Treatments   Labs (all labs ordered are listed, but only abnormal results are displayed) Labs Reviewed -  No data to display  EKG None  Radiology No results found.  Procedures Procedures   Medications Ordered in ED Medications - No data to display  ED Course  I have reviewed the triage vital signs and the nursing notes.  Pertinent labs & imaging results that were available during my care of the patient were reviewed by me and considered in my medical decision making (see chart for details).  Patient is here complaining of symptoms of yeast infection.  She reports no concern for STI exposure.  No evidence of significant pelvic infection or sepsis.  We will start her on Diflucan.    Final Clinical Impression(s) / ED Diagnoses Final diagnoses:  Yeast infection    Rx / DC Orders ED Discharge Orders          Ordered    fluconazole (DIFLUCAN) 200 MG tablet   Once        10/14/20 0933             Terald Sleeper, MD 10/14/20 1105

## 2020-10-14 NOTE — ED Triage Notes (Signed)
"  Vaginal itching, yeast infection with history of the same and I would like a rx for Diflucan" per pt

## 2020-11-25 ENCOUNTER — Other Ambulatory Visit (HOSPITAL_BASED_OUTPATIENT_CLINIC_OR_DEPARTMENT_OTHER): Payer: Self-pay

## 2020-11-25 ENCOUNTER — Other Ambulatory Visit: Payer: Self-pay

## 2020-11-26 ENCOUNTER — Other Ambulatory Visit: Payer: Self-pay

## 2020-11-26 ENCOUNTER — Encounter (HOSPITAL_BASED_OUTPATIENT_CLINIC_OR_DEPARTMENT_OTHER): Payer: Self-pay

## 2020-11-26 ENCOUNTER — Emergency Department (HOSPITAL_BASED_OUTPATIENT_CLINIC_OR_DEPARTMENT_OTHER)
Admission: EM | Admit: 2020-11-26 | Discharge: 2020-11-26 | Disposition: A | Discharge status: Home / Self Care | Payer: Self-pay | Attending: Emergency Medicine | Admitting: Emergency Medicine

## 2020-11-26 ENCOUNTER — Other Ambulatory Visit (HOSPITAL_BASED_OUTPATIENT_CLINIC_OR_DEPARTMENT_OTHER): Payer: Self-pay

## 2020-11-26 DIAGNOSIS — B009 Herpesviral infection, unspecified: Secondary | ICD-10-CM | POA: Diagnosis not present

## 2020-11-26 DIAGNOSIS — B9689 Other specified bacterial agents as the cause of diseases classified elsewhere: Secondary | ICD-10-CM | POA: Diagnosis not present

## 2020-11-26 DIAGNOSIS — N760 Acute vaginitis: Secondary | ICD-10-CM | POA: Diagnosis not present

## 2020-11-26 LAB — URINALYSIS, ROUTINE W REFLEX MICROSCOPIC
Bilirubin Urine: NEGATIVE
Glucose, UA: NEGATIVE mg/dL
Hgb urine dipstick: NEGATIVE
Ketones, ur: NEGATIVE mg/dL
Leukocytes,Ua: NEGATIVE
Nitrite: NEGATIVE
Protein, ur: NEGATIVE mg/dL
Specific Gravity, Urine: 1.025 (ref 1.005–1.030)
pH: 6 (ref 5.0–8.0)

## 2020-11-26 LAB — WET PREP, GENITAL
Sperm: NONE SEEN
Trich, Wet Prep: NONE SEEN
Yeast Wet Prep HPF POC: NONE SEEN

## 2020-11-26 MED ORDER — VALACYCLOVIR HCL 1 G PO TABS
1000.0000 mg | ORAL_TABLET | Freq: Every day | ORAL | 0 refills | Status: AC
  Filled 2020-11-26: qty 5, 5d supply, fill #0

## 2020-11-26 MED ORDER — METRONIDAZOLE 500 MG PO TABS
500.0000 mg | ORAL_TABLET | Freq: Two times a day (BID) | ORAL | 0 refills | Status: AC
  Filled 2020-11-26: qty 14, 7d supply, fill #0

## 2020-11-26 NOTE — ED Notes (Signed)
Patient Alert and oriented to baseline. Stable and ambulatory to baseline. Patient verbalized understanding of the discharge instructions.  Patient belongings were taken by the patient.   

## 2020-11-26 NOTE — ED Triage Notes (Signed)
Complains with increased urgency and frequency. Denies any urinary discomfort. States she is also having vaginal discharge.

## 2020-11-26 NOTE — ED Provider Notes (Signed)
[facility] Emergency Department Provider Note MRN:  [id number]  Arrival date & time: 11/26/20     Chief Complaint   Urinary Frequency and Vaginal Discharge   History of Present Illness   Julie Michael is a 25 y.o. year-old female with a history of herpes presenting to the ED with chief complaint of vaginal discharge.  Patient endorsing an outbreak of her herpes, explains that the lesions, burning during urination.  Also thinks she has a yeast infection, having some vaginal discharge.  Denies any fever, no abdominal pain, no other complaints symptoms mild to moderate, constant.  Review of Systems  A problem-focused ROS was performed. Positive for vaginal discharge, rash.  Patient denies fever.  Patient's Health History    Past Medical History:  Diagnosis Date   Herpes    Yeast infection     History reviewed. No pertinent surgical history.  No family history on file.  Social History   Socioeconomic History   Marital status: Single    Spouse name: Not on file   Number of children: Not on file   Years of education: Not on file   Highest education level: Not on file  Occupational History   Not on file  Tobacco Use   Smoking status: Never   Smokeless tobacco: Never  Vaping Use   Vaping Use: Never used  Substance and Sexual Activity   Alcohol use: No   Drug use: No   Sexual activity: Yes    Birth control/protection: None  Other Topics Concern   Not on file  Social History Narrative   Not on file   Social Determinants of Health   Financial Resource Strain: Not on file  Food Insecurity: Not on file  Transportation Needs: Not on file  Physical Activity: Not on file  Stress: Not on file  Social Connections: Not on file  Intimate Partner Violence: Not on file     Physical Exam   Vitals:   11/26/20 0544  BP: 122/77  Pulse: 70  Resp: 14  Temp: 98.1 F (36.7 C)  SpO2: 98%    CONSTITUTIONAL: Well-appearing, NAD NEURO:   Alert and oriented x 3, no focal deficits EYES:  eyes equal and reactive ENT/NECK:  no LAD, no JVD CARDIO: Regular rate, well-perfused, normal S1 and S2 PULM:  CTAB no wheezing or rhonchi GI/GU:  normal bowel sounds, non-distended, non-tender MSK/SPINE:  No gross deformities, no edema SKIN:  no rash, atraumatic PSYCH:  Appropriate speech and behavior  *Additional and/or pertinent findings included in MDM below  Diagnostic and Interventional Summary    EKG Interpretation  Date/Time:    Ventricular Rate:    PR Interval:    QRS Duration:   QT Interval:    QTC Calculation:   R Axis:     Text Interpretation:         Labs Reviewed  WET PREP, GENITAL - Abnormal; Notable for the following components:      Result Value   Clue Cells Wet Prep HPF POC PRESENT (*)    WBC, Wet Prep HPF POC MANY (*)    All other components within normal limits  URINALYSIS, ROUTINE W REFLEX MICROSCOPIC  GC/CHLAMYDIA PROBE AMP ([place]) NOT AT [facility]    No orders to display    Medications - No data to display   Procedures  /  Critical Care Procedures  ED Course and Medical Decision Making  I have reviewed the triage vital signs, the nursing notes, and pertinent  available records from the EMR.  Listed above are laboratory and imaging tests that I personally ordered, reviewed, and interpreted and then considered in my medical decision making (see below for details).  Pelvic exam to follow, suspect will treat for herpes outbreak and follow-up wet prep.    Possible lesion to the left labia majora, patient endorses herpes related pain to the anus, no obvious lesions to this area visually on exam.  Some white discharge in the vaginal vault, wet prep is positive for clue cells.  Will treat for BV and herpes outbreak, no abdominal pain, no fever, nothing to suggest PID or other emergent process, appropriate for discharge.  Elmer Sow. Pilar Plate, MD [facility]  Health [email or web address]  Final Clinical Impressions(s) / ED Diagnoses     ICD-10-CM   1. BV (bacterial vaginosis)  N76.0    B96.89     2. Herpes simplex infection  B00.9       ED Discharge Orders          Ordered    valACYclovir (VALTREX) 1000 MG tablet  Daily        11/26/20 0656    metroNIDAZOLE (FLAGYL) 500 MG tablet  2 times daily        11/26/20 1771             Discharge Instructions Discussed with and Provided to Patient:     Discharge Instructions      You were evaluated in the Emergency Department and after careful evaluation, we did not find any emergent condition requiring admission or further testing in the hospital.  Your exam/testing today was overall reassuring.  Please take the Flagyl and valacyclovir medications as directed and follow-up with your regular doctor.  Please return to the Emergency Department if you experience any worsening of your condition.  Thank you for allowing [place] to be a part of your care.         Sabas Sous, MD 11/26/20 [phone]

## 2020-11-26 NOTE — Discharge Instructions (Addendum)
You were evaluated in the Emergency Department and after careful evaluation, we did not find any emergent condition requiring admission or further testing in the hospital.  Your exam/testing today was overall reassuring.  Please take the Flagyl and valacyclovir medications as directed and follow-up with your regular doctor.  Please return to the Emergency Department if you experience any worsening of your condition.  Thank you for allowing [place] to be a part of your care.

## 2020-11-27 LAB — GC/CHLAMYDIA PROBE AMP (~~LOC~~) NOT AT ARMC
Chlamydia: NEGATIVE
Comment: NEGATIVE
Comment: NORMAL
Neisseria Gonorrhea: NEGATIVE

## 2021-02-05 ENCOUNTER — Emergency Department (HOSPITAL_BASED_OUTPATIENT_CLINIC_OR_DEPARTMENT_OTHER)
Admission: EM | Admit: 2021-02-05 | Discharge: 2021-02-05 | Disposition: A | Discharge status: Home / Self Care | Payer: Self-pay | Attending: Emergency Medicine | Admitting: Emergency Medicine

## 2021-02-05 ENCOUNTER — Other Ambulatory Visit (HOSPITAL_BASED_OUTPATIENT_CLINIC_OR_DEPARTMENT_OTHER): Payer: Self-pay

## 2021-02-05 ENCOUNTER — Other Ambulatory Visit: Payer: Self-pay

## 2021-02-05 ENCOUNTER — Encounter (HOSPITAL_BASED_OUTPATIENT_CLINIC_OR_DEPARTMENT_OTHER): Payer: Self-pay

## 2021-02-05 DIAGNOSIS — A6000 Herpesviral infection of urogenital system, unspecified: Secondary | ICD-10-CM | POA: Diagnosis not present

## 2021-02-05 MED ORDER — VALACYCLOVIR HCL 500 MG PO TABS
500.0000 mg | ORAL_TABLET | Freq: Two times a day (BID) | ORAL | 0 refills | Status: AC
  Filled 2021-02-05: qty 6, 3d supply, fill #0

## 2021-02-05 NOTE — Discharge Instructions (Addendum)
Please take the antiviral medication as prescribed for your suspected herpes outbreak.  Recommend following up with your primary care doctor.

## 2021-02-05 NOTE — ED Triage Notes (Signed)
Pt reports outbreak of herpes . Red bumps vaginal area for 2 days. Last outbreak august

## 2021-02-05 NOTE — ED Provider Notes (Signed)
[facility] EMERGENCY DEPARTMENT Provider Note   CSN: [id number] Arrival date & time: 02/05/21  [id number]     History Chief Complaint  Patient presents with   Herpes Zoster    Arlyn Yoke is a 25 y.o. female.  Presents to ER with concern for herpes outbreak.  States that this feels similar to prior episode.  Has had multiple flareups previously.  Most recent was in September of this year.  She denies any new sexual partners.  Does not want repeat STD testing today.  Pain is primarily when wiping or touching the area.  No vaginal discharge, no pelvic pain or abdominal pain.  No vomiting or nausea.  No fevers.  HPI     Past Medical History:  Diagnosis Date   Herpes    Yeast infection     Patient Active Problem List   Diagnosis Date Noted   BV (bacterial vaginosis) 12/21/2018   Vagina, candidiasis 12/21/2018   Genital lesion, female 12/21/2018   Exposure to COVID-19 virus 09/11/2018   Strain of abdominal muscle 03/30/2018   Yeast infection 03/30/2018   Healthcare maintenance 07/28/2017   Screen for STD (sexually transmitted disease) 07/28/2017   Discharge from the vagina 09/08/2016   Routine screening for STI (sexually transmitted infection) 09/08/2016   Contraception 09/08/2016   Amenorrhea 04/13/2016   Genital warts 01/23/2015   Sinusitis, chronic 08/09/2013   VISUAL ACUITY, DECREASED, LEFT EYE 05/26/2009    History reviewed. No pertinent surgical history.   OB History   No obstetric history on file.     No family history on file.  Social History   Tobacco Use   Smoking status: Never   Smokeless tobacco: Never  Vaping Use   Vaping Use: Never used  Substance Use Topics   Alcohol use: No   Drug use: No    Home Medications Prior to Admission medications   Medication Sig Start Date End Date Taking? Authorizing Provider  doxycycline (VIBRAMYCIN) 100 MG capsule Take 1 capsule (100 mg total) by mouth 2 (two) times daily. 07/24/20   Carlisle Cater, PA-C  mupirocin cream (BACTROBAN) 2 % Apply 1 application topically 2 (two) times daily. 07/24/20   Carlisle Cater, PA-C  ondansetron (ZOFRAN) 4 MG tablet Take 1 tablet (4 mg total) by mouth every 8 (eight) hours as needed for nausea or vomiting. 07/26/20   Robinson, [place] N, PA-C  terconazole (TERAZOL 7) 0.4 % vaginal cream Place 1 applicator vaginally at bedtime. 09/02/19   Bonnita Hollow, MD  valACYclovir (VALTREX) 500 MG tablet Take 1 tablet (500 mg total) by mouth 2 (two) times daily for 3 days. 02/05/21 02/08/21 Yes Lucrezia Starch, MD    Allergies    Patient has no known allergies.  Review of Systems   Review of Systems  Constitutional:  Negative for chills and fever.  HENT:  Negative for ear pain and sore throat.   Eyes:  Negative for pain and visual disturbance.  Respiratory:  Negative for cough and shortness of breath.   Cardiovascular:  Negative for chest pain and palpitations.  Gastrointestinal:  Negative for abdominal pain and vomiting.  Genitourinary:  Negative for dysuria and hematuria.  Musculoskeletal:  Negative for arthralgias and back pain.  Skin:  Negative for color change and rash.  Neurological:  Negative for seizures and syncope.  All other systems reviewed and are negative.  Physical Exam Updated Vital Signs BP 128/85 (BP Location: Right Arm)   Pulse 73   Temp 98.4 F (  36.9 C) (Oral)   Resp 18   Ht 5' (1.524 m)   Wt 72.1 kg   LMP 01/12/2021   SpO2 99%   BMI 31.05 kg/m   Physical Exam Vitals and nursing note reviewed.  Constitutional:      General: She is not in acute distress.    Appearance: She is well-developed.  HENT:     Head: Normocephalic and atraumatic.  Eyes:     Conjunctiva/sclera: Conjunctivae normal.  Cardiovascular:     Rate and Rhythm: Normal rate and regular rhythm.     Pulses: Normal pulses.  Pulmonary:     Effort: Pulmonary effort is normal. No respiratory distress.  Abdominal:     Palpations: Abdomen is soft.      Tenderness: There is no abdominal tenderness.  Genitourinary:    Comments: RN Robin chaperone  Over the right lower/posterior vulvar, 3 small herpetic lesions noted, no surrounding erythema, no underlying induration or fluctuance noted Musculoskeletal:        General: No swelling.     Cervical back: Neck supple.  Skin:    General: Skin is warm and dry.     Capillary Refill: Capillary refill takes less than 2 seconds.  Neurological:     Mental Status: She is alert.  Psychiatric:        Mood and Affect: Mood normal.        Thought Content: Thought content normal.    ED Results / Procedures / Treatments   Labs (all labs ordered are listed, but only abnormal results are displayed) Labs Reviewed - No data to display  EKG None  Radiology No results found.  Procedures Procedures   Medications Ordered in ED Medications - No data to display  ED Course  I have reviewed the triage vital signs and the nursing notes.  Pertinent labs & imaging results that were available during my care of the patient were reviewed by me and considered in my medical decision making (see chart for details).  Clinical Course as of 02/05/21 0745  Fri Feb 05, 2021  [id number] Zella Ball RN chaperone for external GU exam [RD]    Clinical Course User Index [RD] Milagros Loll, MD   MDM Rules/Calculators/A&P                          25 year old presents to ER with concern for genital herpes.  On exam, noted 3 small raised lesions concerning for mild genital herpes outbreak.  No underlying induration or fluctuance appreciated to suggest abscess.  No overlying erythema.  No other symptoms at present.  Offered repeat STD testing but patient declined.  Will give treatment for recurrent genital herpes outbreak.  Recommended follow-up with primary care doctor, discharged home.  After the discussed management above, the patient was determined to be safe for discharge.  The patient was in agreement with this plan  and all questions regarding their care were answered.  ED return precautions were discussed and the patient will return to the ED with any significant worsening of condition.   Final Clinical Impression(s) / ED Diagnoses Final diagnoses:  Recurrent genital herpes    Rx / DC Orders ED Discharge Orders          Ordered    valACYclovir (VALTREX) 500 MG tablet  2 times daily        02/05/21 0742             Milagros Loll, MD 02/05/21  0750  

## 2021-04-27 ENCOUNTER — Other Ambulatory Visit: Payer: Self-pay

## 2021-04-27 ENCOUNTER — Encounter (HOSPITAL_BASED_OUTPATIENT_CLINIC_OR_DEPARTMENT_OTHER): Payer: Self-pay | Admitting: Emergency Medicine

## 2021-04-27 ENCOUNTER — Other Ambulatory Visit (HOSPITAL_BASED_OUTPATIENT_CLINIC_OR_DEPARTMENT_OTHER): Payer: Self-pay

## 2021-04-27 ENCOUNTER — Emergency Department (HOSPITAL_BASED_OUTPATIENT_CLINIC_OR_DEPARTMENT_OTHER)
Admission: EM | Admit: 2021-04-27 | Discharge: 2021-04-27 | Disposition: A | Discharge status: Home / Self Care | Payer: BC Managed Care – PPO | Attending: Emergency Medicine | Admitting: Emergency Medicine

## 2021-04-27 DIAGNOSIS — A6000 Herpesviral infection of urogenital system, unspecified: Secondary | ICD-10-CM | POA: Diagnosis not present

## 2021-04-27 DIAGNOSIS — N898 Other specified noninflammatory disorders of vagina: Secondary | ICD-10-CM | POA: Diagnosis not present

## 2021-04-27 MED ORDER — VALACYCLOVIR HCL 500 MG PO TABS
500.0000 mg | ORAL_TABLET | Freq: Two times a day (BID) | ORAL | 0 refills | Status: AC
  Filled 2021-04-27: qty 6, 3d supply, fill #0

## 2021-04-27 NOTE — ED Provider Notes (Addendum)
[facility] EMERGENCY DEPARTMENT Provider Note   CSN: [id number] Arrival date & time: 04/27/21  1962     History Chief Complaint  Patient presents with   Outbreak    Julie Michael is a 26 y.o. female.  Reports beginning of herpes outbreak.  Started yesterday.  Painful lesion between vagina and rectum.  Has has similar outbreaks in the past. Last outbreak in Sept/Oct last year.  Takes Valtrex for outbreaks.  Denies any other lesions elsewhere   Home Medications Prior to Admission medications   Medication Sig Start Date End Date Taking? Authorizing Provider  valACYclovir (VALTREX) 500 MG tablet Take 1 tablet (500 mg total) by mouth 2 (two) times daily for 3 days. 04/27/21 04/30/21 Yes Dana Allan, MD  doxycycline (VIBRAMYCIN) 100 MG capsule Take 1 capsule (100 mg total) by mouth 2 (two) times daily. 07/24/20   Renne Crigler, PA-C  mupirocin cream (BACTROBAN) 2 % Apply 1 application topically 2 (two) times daily. 07/24/20   Renne Crigler, PA-C  ondansetron (ZOFRAN) 4 MG tablet Take 1 tablet (4 mg total) by mouth every 8 (eight) hours as needed for nausea or vomiting. 07/26/20   Robinson, [place] N, PA-C  terconazole (TERAZOL 7) 0.4 % vaginal cream Place 1 applicator vaginally at bedtime. 09/02/19   Garnette Gunner, MD      Allergies    Patient has no known allergies.    Review of Systems   Review of Systems  Constitutional:  Negative for fatigue and fever.  Gastrointestinal:  Negative for blood in stool, nausea and rectal pain.   Physical Exam Updated Vital Signs BP 120/75 (BP Location: Left Arm)    Pulse 73    Temp 98.3 F (36.8 C) (Oral)    Resp 14    Ht 5' (1.524 m)    Wt 73.5 kg    LMP 03/31/2021 (Exact Date)    SpO2 98%    BMI 31.64 kg/m  Physical Exam Exam conducted with a chaperone present.  Genitourinary:    General: Normal vulva.     Exam position: Lithotomy position.       Comments: Small painful vesicle    ED Results / Procedures / Treatments    Labs (all labs ordered are listed, but only abnormal results are displayed) Labs Reviewed - No data to display  EKG None  Radiology No results found.  Medications Ordered in ED Medications - No data to display  ED Course/ Medical Decision Making/ A&P  Julie Michael is a 26 y.o. female who presents to the ED for recurrent genital Herpes outbreak.  Well appearing on exam.  No fevers or other lesions noted.  One painful vesicular lesion, in initial stage, noted on perineum without drainage.  Has has multiple outbreaks in past that resolved with Valtrex.     Valtrex 500 mg BID x 3 days Follow up with PCP to discuss prophylactic treatment for recurrent outbreaks.  Follow up with PCP if symptoms worsen or return to ED.                         Medical Decision Making Risk Prescription drug management.   Final Clinical Impression(s) / ED Diagnoses Final diagnoses:  Genital herpes simplex, unspecified site    Rx / DC Orders ED Discharge Orders          Ordered    valACYclovir (VALTREX) 500 MG tablet  2 times daily        04/27/21  [id number]              Dana Allan, MD 04/27/21 [id number]    Maia Plan, MD 04/27/21 2951    Dana Allan, MD 04/27/21 Mora Bellman    Maia Plan, MD 04/29/21 [id number]

## 2021-04-27 NOTE — ED Notes (Signed)
AVS provided to client, informed that a Rx was sent to her pharmacy listed on her EMR. Explained medication to client and reinforced the importance of taking medication as prescribed. Opportunity for questions provided prior to DC to home

## 2021-04-27 NOTE — ED Triage Notes (Signed)
Pt states she has herpes simplex 2 and is starting to have an outbreak and is trying to get in front of it before it gets bad

## 2021-04-27 NOTE — Discharge Instructions (Addendum)
Follow up with PCP to discuss prophylactic treatment

## 2021-05-31 ENCOUNTER — Emergency Department (HOSPITAL_BASED_OUTPATIENT_CLINIC_OR_DEPARTMENT_OTHER)
Admission: EM | Admit: 2021-05-31 | Discharge: 2021-05-31 | Disposition: A | Discharge status: Home / Self Care | Payer: BC Managed Care – PPO | Attending: Emergency Medicine | Admitting: Emergency Medicine

## 2021-05-31 ENCOUNTER — Other Ambulatory Visit: Payer: Self-pay

## 2021-05-31 ENCOUNTER — Other Ambulatory Visit (HOSPITAL_BASED_OUTPATIENT_CLINIC_OR_DEPARTMENT_OTHER): Payer: Self-pay

## 2021-05-31 ENCOUNTER — Encounter (HOSPITAL_BASED_OUTPATIENT_CLINIC_OR_DEPARTMENT_OTHER): Payer: Self-pay

## 2021-05-31 DIAGNOSIS — A609 Anogenital herpesviral infection, unspecified: Secondary | ICD-10-CM | POA: Insufficient documentation

## 2021-05-31 DIAGNOSIS — B009 Herpesviral infection, unspecified: Secondary | ICD-10-CM | POA: Diagnosis not present

## 2021-05-31 DIAGNOSIS — A6000 Herpesviral infection of urogenital system, unspecified: Secondary | ICD-10-CM | POA: Diagnosis not present

## 2021-05-31 DIAGNOSIS — A6009 Herpesviral infection of other urogenital tract: Secondary | ICD-10-CM | POA: Diagnosis not present

## 2021-05-31 DIAGNOSIS — A599 Trichomoniasis, unspecified: Secondary | ICD-10-CM

## 2021-05-31 DIAGNOSIS — A6004 Herpesviral vulvovaginitis: Secondary | ICD-10-CM | POA: Diagnosis not present

## 2021-05-31 LAB — WET PREP, GENITAL
Sperm: NONE SEEN
WBC, Wet Prep HPF POC: <10 (ref <10)
Yeast Wet Prep HPF POC: NONE SEEN

## 2021-05-31 LAB — PREGNANCY, URINE: Preg Test, Ur: NEGATIVE

## 2021-05-31 MED ORDER — VALACYCLOVIR HCL 500 MG PO TABS
500.0000 mg | ORAL_TABLET | Freq: Two times a day (BID) | ORAL | 0 refills | Status: AC
  Filled 2021-05-31: qty 6, 3d supply, fill #0

## 2021-05-31 MED ORDER — LIDOCAINE HCL (PF) 1 % IJ SOLN
INTRAMUSCULAR | Status: AC
  Administered 2021-05-31: 5 mL
  Filled 2021-05-31: qty 5

## 2021-05-31 MED ORDER — DOXYCYCLINE HYCLATE 100 MG PO CAPS
100.0000 mg | ORAL_CAPSULE | Freq: Two times a day (BID) | ORAL | 0 refills | Status: AC
  Filled 2021-05-31: qty 14, 7d supply, fill #0

## 2021-05-31 MED ORDER — VALACYCLOVIR HCL 500 MG PO TABS
500.0000 mg | ORAL_TABLET | Freq: Every day | ORAL | 3 refills | Status: AC
  Filled 2021-05-31 – 2021-11-23 (×2): qty 30, 30d supply, fill #0
  Filled 2022-04-07: qty 30, 30d supply, fill #1

## 2021-05-31 MED ORDER — CEFTRIAXONE SODIUM 500 MG IJ SOLR
500.0000 mg | Freq: Once | INTRAMUSCULAR | Status: AC
  Administered 2021-05-31: 500 mg via INTRAMUSCULAR
  Filled 2021-05-31: qty 500

## 2021-05-31 MED ORDER — METRONIDAZOLE 500 MG PO TABS
500.0000 mg | ORAL_TABLET | Freq: Two times a day (BID) | ORAL | 0 refills | Status: AC
  Filled 2021-05-31: qty 14, 7d supply, fill #0

## 2021-05-31 NOTE — ED Provider Notes (Signed)
?[facility] EMERGENCY DEPARTMENT ?Provider Note ? ? ?CSN: [id number] ?Arrival date & time: 05/31/21  [id number] ? ?  ? ?History ? ?Chief Complaint  ?Patient presents with  ? lesion  ? ? ?Julie Michael is a 26 y.o. female. ? ?HPI ? ?  ? ?26 year old female with a history of recurrent genital HSV, presents with concern for flare of HSV. ? ?Reports that over the last year, she has been having more frequent flareups, with her last episode being in February.  The episode prior to that was in November.  Denies having any abdominal pain, nausea, vomiting, dysuria, or symptomatic vaginal discharge. ?Reports that she has small lesions over her right labia that hurt with wiping.  She is wondering about suppressive therapy as she has been having more frequent outbreaks.  She currently has a appointment scheduled with a primary care doctor with the earliest appointment being in July.  She is sexually active with the same partner.  She has had fairly recent testing for HIV, syphilis ? ? ? ?Past Medical History:  ?Diagnosis Date  ? Herpes   ? Yeast infection   ?  ?Home Medications ?Prior to Admission medications   ?Medication Sig Start Date End Date Taking? Authorizing Provider  ?valACYclovir (VALTREX) 500 MG tablet Take 1 tablet (500 mg total) by mouth 2 (two) times daily for 3 days. 05/31/21 06/03/21 Yes Gareth Morgan, MD  ?valACYclovir (VALTREX) 500 MG tablet Take 1 tablet (500 mg total) by mouth daily. 05/31/21 09/28/21 Yes Gareth Morgan, MD  ?doxycycline (VIBRAMYCIN) 100 MG capsule Take 1 capsule (100 mg total) by mouth 2 (two) times daily. 07/24/20   Carlisle Cater, PA-C  ?mupirocin cream (BACTROBAN) 2 % Apply 1 application topically 2 (two) times daily. 07/24/20   Carlisle Cater, PA-C  ?ondansetron (ZOFRAN) 4 MG tablet Take 1 tablet (4 mg total) by mouth every 8 (eight) hours as needed for nausea or vomiting. 07/26/20   Robinson, [place] N, PA-C  ?terconazole (TERAZOL 7) 0.4 % vaginal cream Place 1 applicator vaginally  at bedtime. 09/02/19   Bonnita Hollow, MD  ?   ? ?Allergies    ?Patient has no known allergies.   ? ?Review of Systems   ?Review of Systems ? ?Physical Exam ?Updated Vital Signs ?BP 123/70 (BP Location: Right Arm)   Pulse 79   Temp 98.7 ?F (37.1 ?C) (Oral)   Resp 18   Ht 5' (1.524 m)   Wt 73.5 kg   LMP 05/03/2021 (Exact Date)   SpO2 100%   BMI 31.64 kg/m?  ?Physical Exam ?Vitals and nursing note reviewed. Exam conducted with a chaperone present.  ?Constitutional:   ?   General: She is not in acute distress. ?   Appearance: Normal appearance. She is not ill-appearing, toxic-appearing or diaphoretic.  ?HENT:  ?   Head: Normocephalic.  ?Eyes:  ?   Conjunctiva/sclera: Conjunctivae normal.  ?Cardiovascular:  ?   Rate and Rhythm: Normal rate and regular rhythm.  ?   Pulses: Normal pulses.  ?Pulmonary:  ?   Effort: Pulmonary effort is normal. No respiratory distress.  ?Genitourinary: ?   Labia:     ?   Right: Lesion (2 vesicles/ulceration) present.   ?   Cervix: Discharge present. No cervical motion tenderness.  ?   Uterus: Not tender.   ?   Adnexa:     ?   Right: No tenderness.      ?   Left: No tenderness.    ?Musculoskeletal:     ?  General: No deformity or signs of injury.  ?   Cervical back: No rigidity.  ?Skin: ?   General: Skin is warm and dry.  ?   Coloration: Skin is not jaundiced or pale.  ?Neurological:  ?   General: No focal deficit present.  ?   Mental Status: She is alert and oriented to person, place, and time.  ? ? ?ED Results / Procedures / Treatments   ?Labs ?(all labs ordered are listed, but only abnormal results are displayed) ?Labs Reviewed  ?WET PREP, GENITAL  ?PREGNANCY, URINE  ?GC/CHLAMYDIA PROBE AMP ([place]) NOT AT [facility]  ? ? ?EKG ?None ? ?Radiology ?No results found. ? ?Procedures ?Procedures  ? ? ?Medications Ordered in ED ?Medications - No data to display ? ?ED Course/ Medical Decision Making/ A&P ?  ?                        ?Medical Decision Making ?Amount and/or Complexity of  Data Reviewed ?Labs: ordered. ? ?Risk ?Prescription drug management. ? ? ? ?26 year old female with a history of recurrent genital HSV, presents with concern for flare of HSV. ? ?Has vesicles that have been coming and going over generally the same area over time most consistent with HSV. Has had prior syphilis testing, do not suspect this, monkey pox. No sign of cellulitis. Not having other systemic symptoms, abdominal pain or other concerns.   ? ?Given recurrent episodes of HSV, will give rx for suppressive therapy and refills to support her until her PCP appointment scheduled in July.   ? ?Pregnancy test negative.  Wet prep is positive for trichomonas.  Given presence of STI, will give empiric Rocephin for possible coinfection with gonorrhea.  Given a prescription for doxycycline in case GC/Chl positive.  Given prescription for Flagyl for trichomonas treatment. ? ? ? ? ? ? ? ?Final Clinical Impression(s) / ED Diagnoses ?Final diagnoses:  ?HSV (herpes simplex virus) anogenital infection  ? ? ?Rx / DC Orders ?ED Discharge Orders   ? ?      Ordered  ?  valACYclovir (VALTREX) 500 MG tablet  2 times daily       ? 05/31/21 0726  ?  valACYclovir (VALTREX) 500 MG tablet  Daily       ? 05/31/21 0726  ? ?  ?  ? ?  ? ? ?  ?Gareth Morgan, MD ?05/31/21 0800 ? ?

## 2021-05-31 NOTE — Discharge Instructions (Addendum)
It was a pleasure caring for you today! We have written you a prescription for HSV (valacyclovir), trichomonas (metronidazole) and a prescription you can fill just in case your gonorrhea or chlamydia test is positive (doxycycline.)  ?

## 2021-05-31 NOTE — ED Triage Notes (Signed)
Patient has history of HSV2. Patient believes she is having an outbreak. Patient is here for a medication refill.  ?

## 2021-06-01 LAB — GC/CHLAMYDIA PROBE AMP (~~LOC~~) NOT AT ARMC
Chlamydia: NEGATIVE
Comment: NEGATIVE
Comment: NORMAL
Neisseria Gonorrhea: NEGATIVE

## 2021-06-09 NOTE — Progress Notes (Signed)
? ? ?  SUBJECTIVE:  ? ?CHIEF COMPLAINT / HPI: severe yeast infection  ? ?Patient reports having "severe yeast infection " symptoms including cloudy vaginal discharge.   ?She is sexually active five days after being treated for trichomonas. Patient states that she was able to complete her abx for this infection.  ?She has hx of BV and vaginal candidiasis. She reports seeing white and clumpy vaginal discharge prior to her menses starting today. Patient reports normally having a heavy flow. ?Patient is currently not using any form of contraception. ? ?PERTINENT  PMH / PSH:  ?BV  ?Vaginal candidiasis  ? ?OBJECTIVE:  ? ?BP 117/81   Pulse 63   Ht 5' (1.524 m)   Wt 167 lb 6.4 oz (75.9 kg)   SpO2 100%   BMI 32.69 kg/m?   ?Genitalia:  Normal introitus for age, no external lesions, clumpy red vaginal discharge, mucosa pink and moist, no vaginal or cervical lesions, no vaginal atrophy, no friaility, menses blood pooled in vaginal vault, normal uterus size and position, no adnexal masses or tenderness ? ? ?ASSESSMENT/PLAN:  ? 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  ? ? ?Ronnald Ramp, MD ?[facility]  ?

## 2021-06-10 ENCOUNTER — Other Ambulatory Visit (HOSPITAL_COMMUNITY)
Admission: RE | Admit: 2021-06-10 | Discharge: 2021-06-10 | Disposition: A | Discharge status: Home / Self Care | Payer: BC Managed Care – PPO | Source: Ambulatory Visit | Attending: Family Medicine | Admitting: Family Medicine

## 2021-06-10 ENCOUNTER — Ambulatory Visit (INDEPENDENT_AMBULATORY_CARE_PROVIDER_SITE_OTHER): Payer: BC Managed Care – PPO | Admitting: Family Medicine

## 2021-06-10 VITALS — BP 117/81 | HR 63 | Ht 60.0 in | Wt 167.4 lb

## 2021-06-10 DIAGNOSIS — N898 Other specified noninflammatory disorders of vagina: Secondary | ICD-10-CM

## 2021-06-10 LAB — POCT WET PREP (WET MOUNT)
Clue Cells Wet Prep Whiff POC: NEGATIVE
Trichomonas Wet Prep HPF POC: ABSENT

## 2021-06-10 NOTE — Assessment & Plan Note (Addendum)
Wet prep  ?Will test for gonorrhea and chlamydia  ?Counseled patient to abstain from sex for next 10 days  ?Patient has hx of LSIL and will need f/u pap at next appt, scheduled for 06/23/21  ?Patient would like to discuss contraception options at follow up  ? ?

## 2021-06-10 NOTE — Patient Instructions (Signed)
Today we collected test to look for any signs of bacterial vaginitis, as well as gonorrhea or chlamydia. ? ?I will notify you of any abnormal results via telephone otherwise I will contact you for any abnormal results and any treatment plans moving forward. ? ?Please avoid any sexual activity for the next 10 days. ?

## 2021-06-11 LAB — CERVICOVAGINAL ANCILLARY ONLY
Bacterial Vaginitis (gardnerella): NEGATIVE
Chlamydia: NEGATIVE
Comment: NEGATIVE
Comment: NEGATIVE
Comment: NEGATIVE
Comment: NORMAL
Neisseria Gonorrhea: NEGATIVE
Trichomonas: NEGATIVE

## 2021-06-15 ENCOUNTER — Other Ambulatory Visit: Payer: Self-pay

## 2021-06-15 ENCOUNTER — Emergency Department (HOSPITAL_BASED_OUTPATIENT_CLINIC_OR_DEPARTMENT_OTHER)
Admission: EM | Admit: 2021-06-15 | Discharge: 2021-06-15 | Disposition: A | Discharge status: Home / Self Care | Payer: BC Managed Care – PPO | Attending: Emergency Medicine | Admitting: Emergency Medicine

## 2021-06-15 ENCOUNTER — Encounter (HOSPITAL_BASED_OUTPATIENT_CLINIC_OR_DEPARTMENT_OTHER): Payer: Self-pay | Admitting: *Deleted

## 2021-06-15 DIAGNOSIS — B3731 Acute candidiasis of vulva and vagina: Secondary | ICD-10-CM | POA: Insufficient documentation

## 2021-06-15 DIAGNOSIS — N898 Other specified noninflammatory disorders of vagina: Secondary | ICD-10-CM | POA: Diagnosis not present

## 2021-06-15 LAB — URINALYSIS, ROUTINE W REFLEX MICROSCOPIC
Bilirubin Urine: NEGATIVE
Glucose, UA: NEGATIVE mg/dL
Hgb urine dipstick: NEGATIVE
Ketones, ur: NEGATIVE mg/dL
Leukocytes,Ua: NEGATIVE
Nitrite: NEGATIVE
Protein, ur: NEGATIVE mg/dL
Specific Gravity, Urine: 1.02 (ref 1.005–1.030)
pH: 6 (ref 5.0–8.0)

## 2021-06-15 LAB — WET PREP, GENITAL
Clue Cells Wet Prep HPF POC: NONE SEEN
Sperm: NONE SEEN
Trich, Wet Prep: NONE SEEN
WBC, Wet Prep HPF POC: <10 (ref <10)

## 2021-06-15 LAB — PREGNANCY, URINE: Preg Test, Ur: NEGATIVE

## 2021-06-15 MED ORDER — FLUCONAZOLE 150 MG PO TABS
150.0000 mg | ORAL_TABLET | Freq: Once | ORAL | Status: AC
Start: 2021-06-15 — End: 2021-06-15
  Administered 2021-06-15: 150 mg via ORAL
  Filled 2021-06-15: qty 1

## 2021-06-15 MED ORDER — FLUCONAZOLE 150 MG PO TABS: 150.0000 mg | ORAL_TABLET | Freq: Once | ORAL | 0 refills | Status: AC

## 2021-06-15 NOTE — ED Provider Notes (Signed)
?[facility] EMERGENCY DEPARTMENT ?Provider Note ? ? ?CSN: [id number] ?Arrival date & time: 06/15/21  1656 ? ?  ? ?History ?PMH Trichomonas, genital herpes ?Chief Complaint  ?Patient presents with  ? Vaginal Discharge  ? ? ?Julie Michael is a 26 y.o. female.  Patient presents to the emergency department with a chief complaint of vaginal irritation.  She states that she had a recently diagnosed trichomonas infection about 3 weeks ago.  She received treatment for this.  She says after the infection, she noticed that she had some irritation around her vagina.  She did not really notice any abnormal vaginal discharge or any itchiness.  She says she is had yeast infections in the past and is concerned that this is a yeast infection.  She has been seen at least 2 times for the exact same issue has had multiple nonhormonal wet preps, negative gonorrhea chlamydia, negative repeat trichomonas.  She presents today because she is still concerned she has a yeast infection. She has not been sexually active since her diagnosed trichomonas infection. She did say that she just recently started putting new laundry detergent beads in her laundry about 3 weeks ago. ? ? ?Vaginal Discharge ? ?  ? ?Home Medications ?Prior to Admission medications   ?Medication Sig Start Date End Date Taking? Authorizing Provider  ?fluconazole (DIFLUCAN) 150 MG tablet Take 1 tablet (150 mg total) by mouth once for 1 dose. 06/15/21 06/15/21 Yes Zariah Cavendish, Finis Bud, PA-C  ?mupirocin cream (BACTROBAN) 2 % Apply 1 application topically 2 (two) times daily. 07/24/20   Renne Crigler, PA-C  ?ondansetron (ZOFRAN) 4 MG tablet Take 1 tablet (4 mg total) by mouth every 8 (eight) hours as needed for nausea or vomiting. 07/26/20   Robinson, [place] N, PA-C  ?terconazole (TERAZOL 7) 0.4 % vaginal cream Place 1 applicator vaginally at bedtime. 09/02/19   Garnette Gunner, MD  ?valACYclovir (VALTREX) 500 MG tablet Take 1 tablet (500 mg total) by mouth daily. 05/31/21  09/28/21  Alvira Monday, MD  ?   ? ?Allergies    ?Patient has no known allergies.   ? ?Review of Systems   ?Review of Systems  ?Genitourinary:  Positive for vaginal discharge.  ?All other systems reviewed and are negative. ? ?Physical Exam ?Updated Vital Signs ?BP 128/82   Pulse 75   Temp 98.1 ?F (36.7 ?C) (Oral)   Resp 15   Ht 5' (1.524 m)   Wt 75.8 kg   LMP 06/08/2021   SpO2 99%   BMI 32.61 kg/m?  ?Physical Exam ?Vitals and nursing note reviewed. Exam conducted with a chaperone present.  ?Constitutional:   ?   General: She is not in acute distress. ?   Appearance: Normal appearance. She is well-developed. She is not ill-appearing, toxic-appearing or diaphoretic.  ?HENT:  ?   Head: Normocephalic and atraumatic.  ?   Nose: No nasal deformity.  ?   Mouth/Throat:  ?   Lips: Pink. No lesions.  ?Eyes:  ?   General: Gaze aligned appropriately. No scleral icterus.    ?   Right eye: No discharge.     ?   Left eye: No discharge.  ?   Conjunctiva/sclera: Conjunctivae normal.  ?   Right eye: Right conjunctiva is not injected. No exudate or hemorrhage. ?   Left eye: Left conjunctiva is not injected. No exudate or hemorrhage. ?Pulmonary:  ?   Effort: Pulmonary effort is normal. No respiratory distress.  ?Genitourinary: ?   Exam position: Lithotomy  position.  ?   Labia:     ?   Right: Tenderness present.     ?   Left: Tenderness present.   ?   Vagina: Normal.  ?   Cervix: No cervical motion tenderness, discharge or erythema.  ?   Uterus: Normal.   ?   Adnexa:     ?   Right: No tenderness.      ?   Left: No tenderness.    ?   Comments: Vulva appears irritated around the labia and perineal area. Vagina opening is normal. Cervix without erythema. There is white discharge, but not large amount. NO CMT or adnexal tenderness. ?Skin: ?   General: Skin is warm and dry.  ?Neurological:  ?   Mental Status: She is alert and oriented to person, place, and time.  ?Psychiatric:     ?   Mood and Affect: Mood normal.     ?   Speech:  Speech normal.     ?   Behavior: Behavior normal. Behavior is cooperative.  ? ? ?ED Results / Procedures / Treatments   ?Labs ?(all labs ordered are listed, but only abnormal results are displayed) ?Labs Reviewed  ?WET PREP, GENITAL - Abnormal; Notable for the following components:  ?    Result Value  ? Yeast Wet Prep HPF POC PRESENT (*)   ? All other components within normal limits  ?PREGNANCY, URINE  ?URINALYSIS, ROUTINE W REFLEX MICROSCOPIC  ?GC/CHLAMYDIA PROBE AMP ([place]) NOT AT [facility]  ? ? ?EKG ?None ? ?Radiology ?No results found. ? ?Procedures ?Procedures  ? ?Medications Ordered in ED ?Medications  ?fluconazole (DIFLUCAN) tablet 150 mg (150 mg Oral Given 06/15/21 1809)  ? ? ?ED Course/ Medical Decision Making/ A&P ?  ?                        ?Medical Decision Making ?Amount and/or Complexity of Data Reviewed ?Labs: ordered. ? ?Risk ?Prescription drug management. ? ? ? ?MDM  ?This is a 26 y.o. female who presents to the ED with vaginal irritation ?The differential of this patient includes but is not limited to chemical irritant STD, yeast infection, BV ? ?My Impression, Plan, and ED Course: Well appearing female.  Normal vitals.  Low concern for PID or cervical infection.  She does have some thick white discharge, but does not appear abnormal. Will repeat pelvic, but concern for chemical irritant is also present. ? ?I personally ordered, reviewed, and interpreted all laboratory work and imaging and agree with radiologist interpretation. Results interpreted below: Pregnancy negative.  Urinalysis is unremarkable.  Wet prep is positive for yeast.  Gonorrhea chlamydia are pending. ? ?Patient does have a positive yeast infection.  We will treat now with one-time dose of fluconazole.  Will prescribe patient second dose of fluconazole if symptoms do not improve within 48 hours.  I also informed patient that if after the second dose, but symptoms do not improve, I would recommend discontinuing the laundry detergent  beads to remove the irritant. ? ? ? ? ?Charting Requirements ?Additional history is obtained from:  Independent historian ?External Records from outside source obtained and reviewed including: Recent Family Medicine Visit on 3/30 ?Social Determinants of Health:  none ?Pertinant PMH that complicates patient's illness: hx of STD ? ?Patient Care ?Problems that were addressed during this visit: ?- Yeast Infection: Acute illness with complication ?Medications given in ED: Fluconazole x1 ?Reevaluation of the patient after these medicines showed that  the patient stayed the same ?Disposition: abx, supportive tx ? ?This is was an independent visit. My attending physician was immediately available if needed. ? ?Portions of this note were generated with Scientist, clinical (histocompatibility and immunogenetics)Dragon dictation software. Dictation errors may occur despite best attempts at proofreading. ?  ? ?Final Clinical Impression(s) / ED Diagnoses ?Final diagnoses:  ?Vaginal yeast infection  ? ? ?Rx / DC Orders ?ED Discharge Orders   ? ?      Ordered  ?  fluconazole (DIFLUCAN) 150 MG tablet   Once       ? 06/15/21 1810  ? ?  ?  ? ?  ? ? ?  ?Claudie LeachLoeffler, Rogue Pautler C, PA-C ?06/15/21 1834 ? ?  ?Milagros Lollykstra, Richard S, MD ?06/17/21 2218 ? ?

## 2021-06-15 NOTE — ED Triage Notes (Signed)
Pt reports vaginal discharge. States she has been tested for STIs and yeast a few days ago which were neg but she still has sx. States recently completed treatment for trich prior to testing last week ?

## 2021-06-15 NOTE — Discharge Instructions (Signed)
We gave you your first dose of Fluconazole here in the ED. If your symptoms are still present after 48 hours, pickup second dose of Fluconazole from  your pharmacy. If your symptoms still persist, I recommend trying to eliminate the laundry beads to see if it is causing irritation ?

## 2021-06-16 LAB — GC/CHLAMYDIA PROBE AMP (~~LOC~~) NOT AT ARMC
Chlamydia: NEGATIVE
Comment: NEGATIVE
Comment: NORMAL
Neisseria Gonorrhea: NEGATIVE

## 2021-06-21 NOTE — Patient Instructions (Incomplete)
It was wonderful to see you today. Thank you for allowing me to be a part of your care. Below is a short summary of what we discussed at your visit today: ? ?*** ?*** ? ?Health Maintenance ?We like to think about ways to keep you healthy for years to come. Below are some interventions and screenings we can offer to keep you healthy: ?- COVID vaccine ?- HPV vaccine ?- TDaP vaccine ? ?Cooking and Nutrition Classes ?[employer] in [place] provides many classes at low or no cost to [employer], nutrition, and agriculture.  Their website offers a huge variety of information related to topics such as gardening, nutrition, cooking, parenting, and health.  Also listed are classes and events, both online and in-person.  Check out their website here: [email or web address]  ? ?Food finder app ?Download the [employer] App or Call 211 to easily find food banks and pantries and other resources nearby.  ? ? ?Please bring all of your medications to every appointment! ? ?If you have any questions or concerns, please do not hesitate to contact us via phone or MyChart message.  ? ?Ezequiel Essex, MD  ? ? ?Employment / Therapist, occupational ?[employer] [place]: [phone] / [place] services (Client preference), Accepting New clients  ?[place] ([place]): [phone] ([place]) / [phone] ([place]) Virtual & Onsite workshops, Accepting new clients  ?[place]: [phone] / [phone] Virtual & Onsite , Accepting New clients  ?[place]: [phone]    ?[employer]: [phone] ([place]) / [phone] ([place]) Virtual, Accepting clients  ?[place]:  [phone] Virtual and Onsite, Accepting new clients   ? ?Financial Assistance ?[place]:  [phone] Virtual (financial assistance) & Onsite (all other services),  Accepting new families  ?[employer]: [phone] Virtual  ?[employer]elsa [employer] (furniture):  [phone]   ?[place] [employer]: [phone]   ?Low Income Energy Assistance  [phone] Virtual, accepting new applicants  ? ?Food Assistance ?DHHS- SNAP/ Food Stamps: 8484479951 Virtual  ?WIC: [phone] ;  [place] [phone]   ?[place]   ?[place]   ?During the summer, text "FOOD" to HH:9798663   ? ?General Health / Clinics (Adults) ?[place] [facility] (for Adults) through [employer]: [phone]   ?[place]:   [phone]   ?[place]:   [phone]   ?Health Department:  [phone] Onsite, accepting new patients  ?[facility] [place]:   [phone] Onsite, accepting new clients  ?[facility]:   [phone] x [phone] Onsite, accepting new clients  ?  ?Housing ?[place]:   [phone]  ?[place]:  [phone]  ?Affordable Housing Management:  [phone]  ?[place]:  [phone]  ?Solicitor / [facility] [place]:  [phone] / [phone]   ?[place]:  [phone]  ?Housing ?The CARES Act temporarily banned evictions and late fees  until July 25th (Saturday). Below are some resources and programs in [employer] for folks to look to for assistance with back payments of rent and other ways of getting help to remain in their homes.  ?Additional Resources: ?[employer] ([place] enclosed) ?[place] ([place] enclosed) ?[employer] [phone] ?[employer] [phone] ?[employer] [phone] ?Nurse, learning disability and Housing Assistance ?[employer] is primarily [employer].  [place] is [place] only.  In [place] [facility], [place] and [employer]  provide assistance.  The link shows some agencies providing assistance in other counties. If you are aware of others, please share.  ?  ? ?Transportation ?Medicaid Transportation: 8255393756 to apply  ?[place]: [phone] (reduced-fare bus ID to [place]  ?[employer] Paratransit services: Eligible riders only, call [phone] for application  ? ?Childcare ?[place] Child [employer]: [phone] ([place]) / [phone] ([place]) ? - Child Care Resources/ Referrals/ Scholarships ? - Head Start/ Early Head Start (call or apply online)  ?[place] DHHS:  Pre-K [phone] / [phone]  ?  ?

## 2021-06-23 ENCOUNTER — Ambulatory Visit: Payer: BC Managed Care – PPO | Admitting: Family Medicine

## 2021-08-17 ENCOUNTER — Encounter: Payer: Self-pay | Admitting: *Deleted

## 2021-11-09 ENCOUNTER — Ambulatory Visit
Admission: RE | Admit: 2021-11-09 | Discharge: 2021-11-09 | Disposition: A | Discharge status: Home / Self Care | Payer: BC Managed Care – PPO | Source: Ambulatory Visit | Attending: Urgent Care | Admitting: Urgent Care

## 2021-11-09 VITALS — BP 124/86 | HR 71 | Temp 98.6°F | Resp 16

## 2021-11-09 DIAGNOSIS — N912 Amenorrhea, unspecified: Secondary | ICD-10-CM | POA: Diagnosis not present

## 2021-11-09 NOTE — ED Provider Notes (Signed)
  [facility]   MRN: [id number] DOB: May 02, 1995  Subjective:   Julie Michael is a 26 y.o. female presenting for amenorrhea for 10 days now.  Patient has a history of this but was able to track her cycle of this year and has been consistently regular this year.  She does not have a gynecologist.  She just got checked for sexually transmitted infections and prefers to avoid repeat testing.  She has previously been on hormonal contraception to help with her cycle but it made it worse. Denies fever, n/v, abdominal pain, pelvic pain, rashes, dysuria, urinary frequency, hematuria, vaginal discharge.  She did 2 home pregnancy test and both were negative and therefore she wanted to confirm with the blood test through our clinic.   No current facility-administered medications for this encounter.  Current Outpatient Medications:    mupirocin cream (BACTROBAN) 2 %, Apply 1 application topically 2 (two) times daily., Disp: 15 g, Rfl: 0   ondansetron (ZOFRAN) 4 MG tablet, Take 1 tablet (4 mg total) by mouth every 8 (eight) hours as needed for nausea or vomiting., Disp: 20 tablet, Rfl: 0   terconazole (TERAZOL 7) 0.4 % vaginal cream, Place 1 applicator vaginally at bedtime., Disp: 45 g, Rfl: 0   No Known Allergies  Past Medical History:  Diagnosis Date   Herpes    Yeast infection      History reviewed. No pertinent surgical history.  Family History  Problem Relation Age of Onset   Diabetes Other    Hypertension Other     Social History   Tobacco Use   Smoking status: Never   Smokeless tobacco: Never  Vaping Use   Vaping Use: Never used  Substance Use Topics   Alcohol use: No   Drug use: No    ROS   Objective:   Vitals: BP 124/86   Pulse 71   Temp 98.6 F (37 C) (Oral)   Resp 16   LMP  (LMP Unknown)   SpO2 98%   Physical Exam Constitutional:      General: She is not in acute distress.    Appearance: Normal appearance. She is well-developed.  She is not ill-appearing, toxic-appearing or diaphoretic.  HENT:     Head: Normocephalic and atraumatic.     Nose: Nose normal.     Mouth/Throat:     Mouth: Mucous membranes are moist.  Eyes:     General: No scleral icterus.       Right eye: No discharge.        Left eye: No discharge.     Extraocular Movements: Extraocular movements intact.  Cardiovascular:     Rate and Rhythm: Normal rate.  Pulmonary:     Effort: Pulmonary effort is normal.  Skin:    General: Skin is warm and dry.  Neurological:     General: No focal deficit present.     Mental Status: She is alert and oriented to person, place, and time.  Psychiatric:        Mood and Affect: Mood normal.        Behavior: Behavior normal.     Assessment and Plan :   PDMP not reviewed this encounter.  1. Amenorrhea     Undifferentiated amenorrhea.  Recommended using a beta hCG test.  Establish care with a gynecologist through the [facility].  Will notify patient of her results as soon as they are available.   Wallis Bamberg, [place] 11/09/21 [phone]

## 2021-11-09 NOTE — ED Triage Notes (Signed)
Pt. Is concerned for possible pregnancy. States she is 10 days late on her period.

## 2021-11-10 LAB — HCG, SERUM, QUALITATIVE: hCG,Beta Subunit,Qual,Serum: NEGATIVE m[IU]/mL (ref <6)

## 2021-11-16 ENCOUNTER — Ambulatory Visit: Payer: BC Managed Care – PPO | Admitting: Family Medicine

## 2021-11-23 ENCOUNTER — Other Ambulatory Visit (HOSPITAL_BASED_OUTPATIENT_CLINIC_OR_DEPARTMENT_OTHER): Payer: Self-pay

## 2022-01-13 DIAGNOSIS — Z113 Encounter for screening for infections with a predominantly sexual mode of transmission: Secondary | ICD-10-CM | POA: Diagnosis not present

## 2022-01-13 DIAGNOSIS — Z01419 Encounter for gynecological examination (general) (routine) without abnormal findings: Secondary | ICD-10-CM | POA: Diagnosis not present

## 2022-02-24 ENCOUNTER — Encounter: Payer: Self-pay | Admitting: Plastic Surgery

## 2022-02-24 ENCOUNTER — Ambulatory Visit (INDEPENDENT_AMBULATORY_CARE_PROVIDER_SITE_OTHER): Payer: BC Managed Care – PPO | Admitting: Plastic Surgery

## 2022-02-24 VITALS — BP 114/72 | HR 71 | Ht 60.0 in | Wt 176.4 lb

## 2022-02-24 DIAGNOSIS — Z6834 Body mass index (BMI) 34.0-34.9, adult: Secondary | ICD-10-CM | POA: Diagnosis not present

## 2022-02-24 DIAGNOSIS — M542 Cervicalgia: Secondary | ICD-10-CM | POA: Diagnosis not present

## 2022-02-24 DIAGNOSIS — N62 Hypertrophy of breast: Secondary | ICD-10-CM

## 2022-02-24 DIAGNOSIS — M546 Pain in thoracic spine: Secondary | ICD-10-CM

## 2022-02-24 NOTE — Progress Notes (Signed)
Referring Provider Fayette Pho, MD [address] [place],  [place] [address]   CC:  Chief Complaint  Patient presents with   Consult      Julie Michael is an 26 y.o. female.  HPI: Julie Michael is a 26 year old female who presents today with complaints of upper back and neck pain and grooving in her shoulders from her bra straps which she feels are due to the large size of her breast.  She is interested in breast reduction.  No Known Allergies  Outpatient Encounter Medications as of 02/24/2022  Medication Sig   mupirocin cream (BACTROBAN) 2 % Apply 1 application topically 2 (two) times daily.   ondansetron (ZOFRAN) 4 MG tablet Take 1 tablet (4 mg total) by mouth every 8 (eight) hours as needed for nausea or vomiting.   terconazole (TERAZOL 7) 0.4 % vaginal cream Place 1 applicator vaginally at bedtime.   No facility-administered encounter medications on file as of 02/24/2022.     Past Medical History:  Diagnosis Date   Herpes    Yeast infection     No past surgical history on file.  Family History  Problem Relation Age of Onset   Diabetes Other    Hypertension Other     Social History   Social History Narrative   Not on file     Review of Systems General: Denies fevers, chills, weight loss CV: Denies chest pain, shortness of breath, palpitations Breast: Patient feels that the large size of her breast are causing her upper back and neck pain and interfere with her daily activities.  Feels that she cannot exercise effectively due to the large size of her breast.  She states that she must wear multiple bras each day.  Occasionally she has rashes under her breasts due to sweating.  She treats these with over the counter topical medications.  She denies a personal history of breast disease and any family history of breast cancer.  Physical Exam    02/24/2022    8:28 AM 11/09/2021    3:39 PM 06/15/2021    6:15 PM  Vitals with BMI  Height 5\' 0"     Weight 176 lbs  6 oz    BMI 34.45    Systolic 114 124  Diastolic 72 86 82  Pulse 71 71 75    General:  No acute distress,  Alert and oriented, Non-Toxic, Normal speech and affect Breast: Patient has very large pendulous breast with grade 3 ptosis.  There are no dominant masses and the nipples appear normal and without nipple discharge.  Her sternal notch to nipple distance is 38 cm on the right and 39 cm on the left her fold to nipple distance on the left is 19 cm and 19 cm on the right. Mammogram: She has not begun mammographic screening. Assessment/Plan Symptomatic macromastia: The patient has very large breasts and would be a good candidate for breast reduction.  I believe that I could remove at least 500 g per breast.  We discussed the procedure at length.  With her the location of the incisions and we discussed the unpredictable nature of scarring.  We discussed the usual surgical risks of bleeding, infection, seroma formation.  We also discussed the specific risks with a breast reduction including loss of the nipple due to ischemia and difficulty breast-feeding after breast reductions.  The fact that there will be some changes in sensation in the nipple and in the breast skin.  I told her I  use drains postoperatively.  We discussed the physical limitations including no heavy lifting greater than 20 pounds, no vigorous activity, and no submerging the incisions in water for 6 weeks.  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  She understands that she should not drive until she has been off narcotic pain medicine for 24 hours.  All questions were answered to her satisfaction.  We will submit her for consideration of breast reduction and scheduling if approved.  Santiago Glad 02/24/2022, 9:36 AM

## 2022-02-28 ENCOUNTER — Encounter: Payer: Self-pay | Admitting: Physical Therapy

## 2022-02-28 ENCOUNTER — Ambulatory Visit: Payer: BC Managed Care – PPO | Attending: Plastic Surgery | Admitting: Physical Therapy

## 2022-02-28 DIAGNOSIS — N62 Hypertrophy of breast: Secondary | ICD-10-CM | POA: Insufficient documentation

## 2022-02-28 DIAGNOSIS — R293 Abnormal posture: Secondary | ICD-10-CM | POA: Diagnosis not present

## 2022-02-28 DIAGNOSIS — M542 Cervicalgia: Secondary | ICD-10-CM | POA: Insufficient documentation

## 2022-02-28 NOTE — Therapy (Signed)
OUTPATIENT PHYSICAL THERAPY CERVICAL EVALUATION   Patient Name: Julie Michael MRN: [id number] DOB:04/12/1995, 26 y.o., female Today's Date: 02/28/2022  END OF SESSION:  PT End of Session - 02/28/22 0807     Visit Number 1    Number of Visits 6    Date for PT Re-Evaluation 04/11/22    Authorization Type BCBS    PT Start Time 0807    PT Stop Time 0842    PT Time Calculation (min) 35 min    Activity Tolerance Patient tolerated treatment well    Behavior During Therapy [facility] for tasks assessed/performed             Past Medical History:  Diagnosis Date   Herpes    Yeast infection    History reviewed. No pertinent surgical history. Patient Active Problem List   Diagnosis Date Noted   BV (bacterial vaginosis) 12/21/2018   Vagina, candidiasis 12/21/2018   Genital lesion, female 12/21/2018   Exposure to COVID-19 virus 09/11/2018   Strain of abdominal muscle 03/30/2018   Yeast infection 03/30/2018   Healthcare maintenance 07/28/2017   Screen for STD (sexually transmitted disease) 07/28/2017   Discharge from the vagina 09/08/2016   Routine screening for STI (sexually transmitted infection) 09/08/2016   Contraception 09/08/2016   Amenorrhea 04/13/2016   Genital warts 01/23/2015   Sinusitis, chronic 08/09/2013   Vaginal discharge 12/05/2011   VISUAL ACUITY, DECREASED, LEFT EYE 05/26/2009    PCP: Fayette Pho, MD  REFERRING PROVIDER: Santiago Glad, MD   REFERRING DIAG: N62 (ICD-10-CM) - Symptomatic mammary hypertrophy   THERAPY DIAG:  Cervicalgia  Abnormal posture  Rationale for Evaluation and Treatment: Rehabilitation  ONSET DATE: 10+ years  SUBJECTIVE:                                                                                                                                                                                                         SUBJECTIVE STATEMENT: Patient reports she has been having neck and upper back pain since middle  school, she is planning on getting breast reduction surgery.   PERTINENT HISTORY:  No significant medical history  PAIN:  Are you having pain? Yes: NPRS scale: 4-7/10 Pain location: neck and upper back Pain description: ache Aggravating factors: being on feet all day, end of day worst, feels pulled down by heaviness in chest Relieving factors: pain medication.  PRECAUTIONS: None  WEIGHT BEARING RESTRICTIONS: No  FALLS:  Has patient fallen in last 6 months? No  LIVING ENVIRONMENT: Lives with: lives with their family Lives in: House/apartment Stairs: No  Has following equipment at home: None  OCCUPATION: Holiday representative  - stands all day  PLOF: Independent, works with Psychologist, educational 2x/week  PATIENT GOALS: qualify for breast reduction surgery, reduce the pain  NEXT MD VISIT: will schedule for 6 weeks from now  OBJECTIVE:   DIAGNOSTIC FINDINGS:  No relevant imaging  PATIENT SURVEYS:  NDI 5/50= 10% disability   COGNITION: Overall cognitive status: Within functional limits for tasks assessed  SENSATION: WFL  POSTURE: rounded shoulders, forward head, and increased thoracic kyphosis  PALPATION: Tenderness/tightness/trigger points throughout cervical paraspinals, bil UT and levator scapulae.    CERVICAL ROM:   Active ROM AROM (deg) eval  Flexion 40  Extension 30  Right lateral flexion 44  Left lateral flexion 40  Right rotation 75  Left rotation 70   (Blank rows = not tested)  UPPER EXTREMITY MMT:  MMT Right eval Left eval  Shoulder flexion 5 5  Shoulder extension    Shoulder abduction 5 5  Shoulder adduction    Shoulder internal rotation 5 5  Shoulder external rotation 5 5  Elbow flexion 5 5  Elbow extension 5 5  Wrist flexion 5 5  Wrist extension 5 5  Grip strong strong   (Blank rows = not tested)  UPPER EXTREMITY ROM:  WNL, symmetric, reports tightness no pain    CERVICAL SPECIAL TESTS:  NT  FUNCTIONAL TESTS:  5 times sit to stand: 9.5  seconds  TODAY'S TREATMENT:                                                                                                                              DATE:   02/28/2022 Therapeutic Exercise: to improve strength and mobility.  Demo, verbal and tactile cues throughout for technique.  - Seated Cervical Retraction  - 10 reps - Seated Scapular Retraction   - 10 reps - 3 sec  hold - Standing Backward Shoulder Rolls  - 10 reps - Standing Thoracic Open Book at Wall  - 10 reps bil - Gentle Levator Scapulae Stretch  - 3 reps - 10 sec  hold bil   PATIENT EDUCATION:  Education details: findings, POC including dry needling, initial HEP for postural strengthening.  Person educated: Patient Education method: Explanation, Demonstration, Verbal cues, and Handouts Education comprehension: verbalized understanding and returned demonstration  HOME EXERCISE PROGRAM: Access Code: [email or web address] URL: [email or web address] Date: 02/28/2022 Prepared by: Harrie Foreman   ASSESSMENT:  CLINICAL IMPRESSION: Patient is a 26 y.o. R hand dominant female who was seen today for physical therapy evaluation and treatment for neck/upper back pain due to symptomatic mammary hypertrophy.   She reports history of pain for over 10 years.  She has been working with trainer 2x/week to help with weight loss and strengthening, but continues to have pain.  She demonstrates decreased cervical ROM, tightness bil shoulders, forward head posture, and trigger points throughout cervical/pericapular musculature.  Julie Michael would benefit from skilled physical therapy to improve posture and  decrease pain to improve QOL.   OBJECTIVE IMPAIRMENTS: decreased mobility, decreased ROM, increased fascial restrictions, increased muscle spasms, postural dysfunction, and pain.   ACTIVITY LIMITATIONS: carrying, lifting, bending, standing, and sleeping  PARTICIPATION LIMITATIONS: community activity and occupation  PERSONAL  FACTORS: Time since onset of injury/illness/exacerbation are also affecting patient's functional outcome.   REHAB POTENTIAL: Good  CLINICAL DECISION MAKING: Stable/uncomplicated  EVALUATION COMPLEXITY: Low   GOALS: Goals reviewed with patient? Yes  SHORT TERM GOALS: Target date: 03/14/2022   Patient will be independent with initial HEP.  Baseline: given Goal status: INITIAL  LONG TERM GOALS: Target date: 04/11/2022   Patient will be independent with advanced/ongoing HEP to improve outcomes and carryover.  Baseline: needs progression  Goal status: INITIAL  2.  Patient will report 75% improvement in neck pain to improve QOL.  Baseline:  Goal status: INITIAL  3.  Patient will demonstrate full pain free cervical ROM for safety with driving.  Baseline: see objective, tightness flexion/extension Goal status: INITIAL  4.  Patient will report 0% impairment on NDI  to demonstrate improved functional ability.  Baseline: 10% impairment Goal status: INITIAL  5.  Patient will demonstrate improved posture to decrease muscle imbalance. Baseline: forward head posture Goal status: INITIAL  PLAN:  PT FREQUENCY: 1-2x/week  PT DURATION: 6 weeks  PLANNED INTERVENTIONS: Therapeutic exercises, Therapeutic activity, Neuromuscular re-education, Balance training, Gait training, Patient/Family education, Self Care, Joint mobilization, Dry Needling, Electrical stimulation, Spinal mobilization, Cryotherapy, Moist heat, Ultrasound, Manual therapy, and Re-evaluation  PLAN FOR NEXT SESSION: review and progress postural strengthening, thoracic mobilization exercises, modalities PRN, interested in dry needling.    Jena Gauss, PT, DPT 02/28/2022, 9:00 AM

## 2022-03-04 ENCOUNTER — Ambulatory Visit: Payer: BC Managed Care – PPO

## 2022-03-04 DIAGNOSIS — R293 Abnormal posture: Secondary | ICD-10-CM | POA: Diagnosis not present

## 2022-03-04 DIAGNOSIS — M542 Cervicalgia: Secondary | ICD-10-CM | POA: Diagnosis not present

## 2022-03-04 DIAGNOSIS — N62 Hypertrophy of breast: Secondary | ICD-10-CM | POA: Diagnosis not present

## 2022-03-04 NOTE — Therapy (Signed)
OUTPATIENT PHYSICAL THERAPY CERVICAL EVALUATION   Patient Name: Julie Michael MRN: [id number] DOB:03/09/1996, 26 y.o., female Today's Date: 03/04/2022  END OF SESSION:  PT End of Session - 03/04/22 1018     Visit Number 2    Number of Visits 6    Date for PT Re-Evaluation 04/11/22    Authorization Type BCBS    PT Start Time 0932    PT Stop Time 1014    PT Time Calculation (min) 42 min    Activity Tolerance Patient tolerated treatment well    Behavior During Therapy WFL for tasks assessed/performed              Past Medical History:  Diagnosis Date   Herpes    Yeast infection    History reviewed. No pertinent surgical history. Patient Active Problem List   Diagnosis Date Noted   BV (bacterial vaginosis) 12/21/2018   Vagina, candidiasis 12/21/2018   Genital lesion, female 12/21/2018   Exposure to COVID-19 virus 09/11/2018   Strain of abdominal muscle 03/30/2018   Yeast infection 03/30/2018   Healthcare maintenance 07/28/2017   Screen for STD (sexually transmitted disease) 07/28/2017   Discharge from the vagina 09/08/2016   Routine screening for STI (sexually transmitted infection) 09/08/2016   Contraception 09/08/2016   Amenorrhea 04/13/2016   Genital warts 01/23/2015   Sinusitis, chronic 08/09/2013   Vaginal discharge 12/05/2011   VISUAL ACUITY, DECREASED, LEFT EYE 05/26/2009    PCP: Fayette Pho, MD  REFERRING PROVIDER: Santiago Glad, MD   REFERRING DIAG: N62 (ICD-10-CM) - Symptomatic mammary hypertrophy   THERAPY DIAG:  Cervicalgia  Abnormal posture  Rationale for Evaluation and Treatment: Rehabilitation  ONSET DATE: 10+ years  SUBJECTIVE:                                                                                                                                                                                                         SUBJECTIVE STATEMENT: Pt reports working out last night just fine with no pain, only a little bit  this morning.  PERTINENT HISTORY:  No significant medical history  PAIN:  Are you having pain? Yes: NPRS scale: 3/10 Pain location: neck and upper back Pain description: ache Aggravating factors: being on feet all day, end of day worst, feels pulled down by heaviness in chest Relieving factors: pain medication.  PRECAUTIONS: None  WEIGHT BEARING RESTRICTIONS: No  FALLS:  Has patient fallen in last 6 months? 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    CERVICAL ROM:   Active ROM AROM (deg) eval  Flexion 40  Extension 30  Right lateral flexion 44  Left lateral flexion 40  Right rotation 75  Left rotation 70   (Blank rows = not tested)  UPPER EXTREMITY MMT:  MMT Right eval Left eval  Shoulder flexion 5 5  Shoulder extension    Shoulder abduction 5 5  Shoulder adduction    Shoulder internal rotation 5 5  Shoulder external rotation 5 5  Elbow flexion 5 5  Elbow extension 5 5  Wrist flexion 5 5  Wrist extension 5 5  Grip strong strong   (Blank rows = not tested)  UPPER EXTREMITY ROM:  WNL, symmetric, reports tightness no pain    CERVICAL SPECIAL TESTS:  NT  FUNCTIONAL TESTS:  5 times sit to stand: 9.5 seconds  TODAY'S TREATMENT:                                                                                                                               DATE:   03/04/2022 Therapeutic Exercise: to improve strength and mobility.  Demo, verbal and tactile cues throughout for technique. UBE L1.0 3 min fwd/ 3 min back Chin tucks seated 10x3" Reviewed remainder of HEP Standing horiz ABD RTB 2x10  Standing shld extension RTB 2x10  I,T, on foam roll x 10 bil Supine chest press on foam roll x 10  Supine thoracic hugs on foam roll x 10 Lat pulls and rows 25# x 10 each  02/28/2022 Therapeutic Exercise: to improve strength and mobility.  Demo, verbal and tactile cues throughout for technique.  - Seated Cervical Retraction  - 10 reps - Seated Scapular Retraction   - 10 reps - 3 sec  hold - Standing Backward Shoulder Rolls  - 10 reps - Standing Thoracic Open Book at Wall  - 10 reps bil - Gentle Levator Scapulae Stretch  - 3 reps - 10 sec  hold bil   PATIENT EDUCATION:  Education details: findings, POC including dry needling, initial HEP for postural strengthening.  Person educated: Patient Education method: Explanation, Demonstration, Verbal cues, and Handouts Education comprehension: verbalized understanding and returned demonstration  HOME EXERCISE PROGRAM: Access Code: [email or web address] URL: [email or web address] Date: 03/04/2022 Prepared by: Verta Ellen  Exercises - Seated Cervical Retraction  - 3 x daily - 7 x weekly - 1 sets - 10 reps - Seated  Scapular Retraction  - 1 x daily - 7 x weekly - 3 sets - 10 reps - 3 sec  hold - Standing Backward Shoulder Rolls  - 3 x daily - 7 x weekly - 1 sets - 10 reps - Standing Thoracic Open Book at Wall  - 1 x daily - 7 x weekly - 1 sets - 10 reps - Gentle Levator Scapulae Stretch  - 1 x daily - 7 x weekly - 1 sets - 3 reps - 10 sec  hold - Standing Shoulder Horizontal Abduction with Anchored Resistance  - 1 x daily - 7 x weekly - 3 sets - 10 reps - Shoulder extension with resistance - Neutral  -  1 x daily - 7 x weekly - 3 sets - 10 reps - Supine Chest Stretch on Foam Roll  - 1 x daily - 7 x weekly - 3 sets - 10 reps - 5 sec hold - Thoracic Mobilization on Foam Roll  - 1 x daily - 7 x weekly - 3 sets - 10 reps   ASSESSMENT:  CLINICAL IMPRESSION: Pt responded well to treatment. Continued to progress postural strengthening and thoracic mobility to reduce sx. Gave info on how to get foam roll for thoracic exercises. She did require cues for proper movement patterns with horiz ABD and Ts on foam roll. Pt would continue to benefit from skilled PT to address deficits.  OBJECTIVE IMPAIRMENTS: decreased mobility, decreased ROM, increased fascial restrictions, increased muscle spasms, postural dysfunction, and pain.   ACTIVITY LIMITATIONS: carrying, lifting, bending, standing, and sleeping  PARTICIPATION LIMITATIONS: community activity and occupation  PERSONAL FACTORS: Time since onset of injury/illness/exacerbation are also affecting patient's functional outcome.   REHAB POTENTIAL: Good  CLINICAL DECISION MAKING: Stable/uncomplicated  EVALUATION COMPLEXITY: Low   GOALS: Goals reviewed with patient? Yes  SHORT TERM GOALS: Target date: 03/14/2022   Patient will be independent with initial HEP.  Baseline: given Goal status: IN PROGRESS  LONG TERM GOALS: Target date: 04/11/2022   Patient will be independent with advanced/ongoing HEP to improve outcomes and carryover.  Baseline: needs progression  Goal status: IN PROGRESS  2.  Patient will report 75% improvement in neck pain to improve QOL.  Baseline:  Goal status: IN PROGRESS  3.  Patient will demonstrate full pain free cervical ROM for safety with driving.  Baseline: see objective, tightness flexion/extension Goal status: IN PROGRESS  4.  Patient will report 0% impairment on NDI  to demonstrate improved functional ability.  Baseline: 10% impairment Goal status: IN PROGRESS  5.  Patient will demonstrate improved posture  to decrease muscle imbalance. Baseline: forward head posture Goal status: IN PROGRESS  PLAN:  PT FREQUENCY: 1-2x/week  PT DURATION: 6 weeks  PLANNED INTERVENTIONS: Therapeutic exercises, Therapeutic activity, Neuromuscular re-education, Balance training, Gait training, Patient/Family education, Self Care, Joint mobilization, Dry Needling, Electrical stimulation, Spinal mobilization, Cryotherapy, Moist heat, Ultrasound, Manual therapy, and Re-evaluation  PLAN FOR NEXT SESSION: review and progress postural strengthening, thoracic mobilization exercises, modalities PRN, interested in dry needling.    Darleene Cleaver, PTA 03/04/2022, 10:18 AM

## 2022-03-14 NOTE — L&D Delivery Note (Signed)
[facility] Delivery Note  Julie Michael is a 27 y.o. G1P0 s/p SVD at [redacted]w[redacted]d. She was admitted for IOL for gHTN.   ROM: 7h 42m with clear fluid GBS Status: Negative Maximum Maternal Temperature: 99.7  Labor Progress: IOL started with cytotec loading dose, then given again at 0539. Pt progressed slowly without further augmentation to SROM, position changes with epidural allowed pt to get to complete and +3.  Delivery Date/Time: 01/09/23 at 1930 Delivery: Called to room and patient was complete and ready to push. Head delivered LOA. No nuchal cord present. 1-min shoulder dystocia followed, which was relieved by McRobert's suprapubic pressure and grasping of the posterior axilla with a right sweep of the shoulder. Shoulder and body then delivered in usual fashion, with FOB assisting. Infant stunned but with spontaneous cry, after stimulation on mother's abdomen. Pt declined cord clamping until delivery of placenta, so delayed until placenta delivery at 36. Cord then clamped x 2 and cut by FOB. Pt declined cord blood or cord sample collection. Fundus firm with massage and Pitocin. Labia, perineum, vagina, and cervix inspected, 2nd laceration found and repaired with 3.0 vicryl with Dr. Para March called in to help assess laceration bleeding, she completed repair.   Placenta: spontaneous, 3-vessel, home with parents Complications: shoulder dystocia Lacerations: 2nd degree, repaired EBL: Analgesia: epidural  Postpartum Planning [x]  transfer orders to MB [x]  discharge summary started & shared [x]  message to sent to schedule follow-up  [x]  lists updated  Infant: Girl  APGARs 6/9  pending  Edd Arbour, CNM, IBCLC Certified Nurse Midwife, [facility] for [employer], [facility] 01/09/2023, 8:23 PM

## 2022-03-16 ENCOUNTER — Encounter: Payer: Self-pay | Admitting: Physical Therapy

## 2022-03-16 ENCOUNTER — Ambulatory Visit: Payer: BC Managed Care – PPO | Attending: Plastic Surgery | Admitting: Physical Therapy

## 2022-03-16 DIAGNOSIS — M542 Cervicalgia: Secondary | ICD-10-CM

## 2022-03-16 DIAGNOSIS — R293 Abnormal posture: Secondary | ICD-10-CM

## 2022-03-16 NOTE — Therapy (Signed)
OUTPATIENT PHYSICAL THERAPY TREATMENT   Patient Name: Julie Michael MRN: [id number] DOB:1995/07/04, 27 y.o., female Today's Date: 03/16/2022  END OF SESSION:  PT End of Session - 03/16/22 0858     Visit Number 3    Number of Visits 6    Date for PT Re-Evaluation 04/11/22    Authorization Type BCBS    PT Start Time [id number]    PT Stop Time 0930    PT Time Calculation (min) 35 min    Activity Tolerance Patient tolerated treatment well    Behavior During Therapy [facility] for tasks assessed/performed              Past Medical History:  Diagnosis Date   Herpes    Yeast infection    History reviewed. No pertinent surgical history. Patient Active Problem List   Diagnosis Date Noted   BV (bacterial vaginosis) 12/21/2018   Vagina, candidiasis 12/21/2018   Genital lesion, female 12/21/2018   Exposure to COVID-19 virus 09/11/2018   Strain of abdominal muscle 03/30/2018   Yeast infection 03/30/2018   Healthcare maintenance 07/28/2017   Screen for STD (sexually transmitted disease) 07/28/2017   Discharge from the vagina 09/08/2016   Routine screening for STI (sexually transmitted infection) 09/08/2016   Contraception 09/08/2016   Amenorrhea 04/13/2016   Genital warts 01/23/2015   Sinusitis, chronic 08/09/2013   Vaginal discharge 12/05/2011   VISUAL ACUITY, DECREASED, LEFT EYE 05/26/2009    PCP: Ezequiel Essex, MD  REFERRING PROVIDER: Camillia Herter, MD   REFERRING DIAG: 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  Has  been doing exercises, really liked the one with long foam roller.   PERTINENT HISTORY:  No significant medical history  PAIN:  Are you having pain? Yes: NPRS scale: 6/10 Pain location: low back, 4/10 neck and upper back Pain description: ache Aggravating factors: being on feet all day, end of day worst, feels pulled down by heaviness in chest Relieving factors: pain medication.  PRECAUTIONS: None  WEIGHT BEARING RESTRICTIONS: No  FALLS:  Has patient fallen in last 6 months? No  LIVING ENVIRONMENT: Lives with: lives  with their family Lives in: House/apartment Stairs: No Has following equipment at home: None  OCCUPATION: Artist  - stands all day  PLOF: Independent, works with Clinical research associate 2x/week  PATIENT GOALS: qualify for breast reduction surgery, reduce the pain  NEXT MD VISIT: will schedule for 6 weeks from now  OBJECTIVE:   DIAGNOSTIC FINDINGS:  No relevant imaging  PATIENT SURVEYS:  NDI 5/50= 10% disability   COGNITION: Overall cognitive status: Within functional limits for tasks assessed  SENSATION: WFL  POSTURE: rounded shoulders, forward head, and increased thoracic kyphosis  PALPATION: Tenderness/tightness/trigger points throughout cervical paraspinals, bil UT and levator scapulae.    CERVICAL ROM:   Active ROM AROM (deg) eval  Flexion 40  Extension 30  Right lateral flexion 44  Left lateral flexion 40  Right rotation 75  Left rotation 70   (Blank rows = not tested)  UPPER EXTREMITY MMT:  MMT Right eval Left eval  Shoulder flexion 5 5  Shoulder extension    Shoulder abduction 5 5  Shoulder adduction    Shoulder internal rotation 5 5  Shoulder external rotation 5 5  Elbow flexion 5 5  Elbow extension 5 5  Wrist flexion 5 5  Wrist extension 5 5  Grip strong strong   (Blank rows = not tested)  UPPER EXTREMITY ROM:  WNL, symmetric, reports tightness no pain    CERVICAL SPECIAL TESTS:  NT  FUNCTIONAL TESTS:  5 times sit to  stand: 9.5 seconds  TODAY'S TREATMENT:                                                                                                                              DATE:  03/16/2022 Therapeutic Exercise: to improve strength and mobility.  Demo, verbal and tactile cues throughout for technique. UBE L1.5 x 6 min (35f3b) Open book stretches s/l x 10 bil  Cat/cow stretch x 10 Child pose stretch x 3 Bird dogs x 10  Thoracic stretch over foam roller  I, Y, T's on foam roll x 10  Quarter squats with foam roller on wall x 10 Manual Therapy: to decrease muscle spasm and pain and improve mobility IASTM with foam roller to bil glutes and lumbar paraspinals.   03/04/2022 Therapeutic Exercise: to improve strength and mobility.  Demo, verbal and tactile cues throughout for technique. UBE L1.0 3 min fwd/ 3 min back Chin tucks seated 10x3" Reviewed remainder of HEP Standing horiz ABD RTB 2x10  Standing shld extension RTB 2x10  I,T, on foam roll x 10 bil Supine chest press on foam roll x 10  Supine thoracic hugs on foam roll x 10 Lat pulls and rows 25# x 10 each  02/28/2022 Therapeutic Exercise: to improve strength and mobility.  Demo, verbal and tactile cues throughout for technique.  - Seated Cervical Retraction  - 10 reps - Seated Scapular Retraction   - 10 reps - 3 sec  hold - Standing Backward Shoulder Rolls  -  10 reps - Standing Thoracic Open Book at [place]  - 10 reps bil - Gentle Levator Scapulae Stretch  - 3 reps - 10 sec  hold bil   PATIENT EDUCATION:  Education details: HEP update, info on where to purchase foam roller Person educated: Patient Education method: Explanation, Demonstration, Verbal cues, and Handouts Education comprehension: verbalized understanding and returned demonstration  HOME EXERCISE PROGRAM: Access Code: [email or web address] URL: [email or web address] Date: 03/16/2022 Prepared by: Glenetta Hew  Exercises - Seated Cervical Retraction  - 3 x daily - 7 x  weekly - 1 sets - 10 reps - Seated Scapular Retraction  - 1 x daily - 7 x weekly - 3 sets - 10 reps - 3 sec  hold - Standing Backward Shoulder Rolls  - 3 x daily - 7 x weekly - 1 sets - 10 reps - Standing Thoracic Open Book at [place] 1 x daily - 7 x weekly - 1 sets - 10 reps - Gentle Levator Scapulae Stretch  - 1 x daily - 7 x weekly - 1 sets - 3 reps - 10 sec  hold - Standing Shoulder Horizontal Abduction with Anchored Resistance  - 1 x daily - 7 x weekly - 3 sets - 10 reps - Shoulder extension with resistance - Neutral  - 1 x daily - 7 x weekly - 3 sets - 10 reps - Supine Chest Stretch on Foam Roll  - 1 x daily - 7 x weekly - 1 sets - 10 reps - 5 sec hold - Thoracic Mobilization on Foam Roll  - 1 x daily - 7 x weekly - 1 sets - 10 reps - Sidelying Open Book Thoracic Lumbar Rotation and Extension  - 1 x daily - 7 x weekly - 1 sets - 10 reps - Standing Thoracic Open Book at [place]  - 1 x daily - 7 x weekly - 1 sets - 10 reps - Cat Cow  - 1 x daily - 7 x weekly - 1 sets - 10 reps - Child's Pose Stretch  - 1 x daily - 7 x weekly - 1 sets - 3 reps - 30 sec hold - Bird Dog  - 1 x daily - 7 x weekly - 3 sets - 10 reps - Wall Quarter Squat with Foam Roller  - 1 x daily - 7 x weekly - 2 sets - 10 reps   ASSESSMENT:  CLINICAL IMPRESSION: Julie Michael arrived 10 min late today.  She reported more low back pain today, but had significant relief with exercises, especially open book stretches and exercises with foam roller.  Brief IASTM with foam roller further alleviated pain.  Given information on where to purchase foam roller and updated HEP.  Julie Michael continues to demonstrate potential for improvement and would benefit from continued skilled therapy to address impairments.     OBJECTIVE IMPAIRMENTS: decreased mobility, decreased ROM, increased fascial restrictions, increased muscle spasms, postural dysfunction, and pain.   ACTIVITY LIMITATIONS: carrying, lifting, bending, standing, and  sleeping  PARTICIPATION LIMITATIONS: community activity and occupation  PERSONAL FACTORS: Time since onset of injury/illness/exacerbation are also affecting patient's functional outcome.   REHAB POTENTIAL: Good  CLINICAL DECISION MAKING: Stable/uncomplicated  EVALUATION COMPLEXITY: Low   GOALS: Goals reviewed with patient? Yes  SHORT TERM GOALS: Target date: 03/14/2022   Patient will be independent with initial HEP.  Baseline: given Goal status: MET 03/16/22  LONG TERM GOALS: Target date: 04/11/2022   Patient will be independent with advanced/ongoing HEP to  improve outcomes and carryover.  Baseline: needs progression  Goal status: IN PROGRESS  2.  Patient will report 75% improvement in neck pain to improve QOL.  Baseline:  Goal status: IN PROGRESS  3.  Patient will demonstrate full pain free cervical ROM for safety with driving.  Baseline: see objective, tightness flexion/extension Goal status: IN PROGRESS  4.  Patient will report 0% impairment on NDI  to demonstrate improved functional ability.  Baseline: 10% impairment Goal status: IN PROGRESS  5.  Patient will demonstrate improved posture to decrease muscle imbalance. Baseline: forward head posture Goal status: IN PROGRESS  PLAN:  PT FREQUENCY: 1-2x/week  PT DURATION: 6 weeks  PLANNED INTERVENTIONS: Therapeutic exercises, Therapeutic activity, Neuromuscular re-education, Balance training, Gait training, Patient/Family education, Self Care, Joint mobilization, Dry Needling, Electrical stimulation, Spinal mobilization, Cryotherapy, Moist heat, Ultrasound, Manual therapy, and Re-evaluation  PLAN FOR NEXT SESSION: continue to progress strengthening, modalities PRN, interested in dry needling.    Rennie Natter, PT, DPT  03/16/2022, 10:26 AM

## 2022-03-18 ENCOUNTER — Ambulatory Visit: Payer: BC Managed Care – PPO

## 2022-03-18 DIAGNOSIS — R293 Abnormal posture: Secondary | ICD-10-CM

## 2022-03-18 DIAGNOSIS — M542 Cervicalgia: Secondary | ICD-10-CM

## 2022-03-18 NOTE — Therapy (Signed)
OUTPATIENT PHYSICAL THERAPY TREATMENT   Patient Name: Julie Michael MRN: [id number] DOB:1995-07-24, 27 y.o., female Today's Date: 03/18/2022  END OF SESSION:  PT End of Session - 03/18/22 0858     Visit Number 4    Number of Visits 6    Date for PT Re-Evaluation 04/11/22    Authorization Type BCBS    PT Start Time [phone]    PT Stop Time 0930    PT Time Calculation (min) 39 min    Activity Tolerance Patient tolerated treatment well    Behavior During Therapy [facility] for tasks assessed/performed              Past Medical History:  Diagnosis Date   Herpes    Yeast infection    History reviewed. No pertinent surgical history. Patient Active Problem List   Diagnosis Date Noted   BV (bacterial vaginosis) 12/21/2018   Vagina, candidiasis 12/21/2018   Genital lesion, female 12/21/2018   Exposure to COVID-19 virus 09/11/2018   Strain of abdominal muscle 03/30/2018   Yeast infection 03/30/2018   Healthcare maintenance 07/28/2017   Screen for STD (sexually transmitted disease) 07/28/2017   Discharge from the vagina 09/08/2016   Routine screening for STI (sexually transmitted infection) 09/08/2016   Contraception 09/08/2016   Amenorrhea 04/13/2016   Genital warts 01/23/2015   Sinusitis, chronic 08/09/2013   Vaginal discharge 12/05/2011   VISUAL ACUITY, DECREASED, LEFT EYE 05/26/2009    PCP: Fayette Pho, MD  REFERRING PROVIDER: Santiago Glad, MD   REFERRING DIAG: 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  PERTINENT HISTORY:  No significant medical history  PAIN:  Are you having pain? No  PRECAUTIONS: None  WEIGHT BEARING RESTRICTIONS: No  FALLS:  Has patient fallen in last 6 months? 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    CERVICAL ROM:   Active ROM AROM (deg) eval  Flexion 40  Extension 30  Right lateral flexion 44  Left lateral flexion 40  Right rotation 75  Left rotation 70   (Blank rows = not tested)  UPPER EXTREMITY MMT:  MMT Right eval Left eval  Shoulder flexion 5 5  Shoulder extension    Shoulder abduction 5 5  Shoulder adduction    Shoulder internal rotation 5 5  Shoulder external rotation 5 5  Elbow flexion 5 5  Elbow extension 5 5  Wrist flexion 5 5  Wrist extension 5 5  Grip strong strong   (Blank rows = not tested)  UPPER EXTREMITY ROM:  WNL, symmetric, reports tightness no pain    CERVICAL SPECIAL TESTS:  NT  FUNCTIONAL TESTS:  5 times sit to stand: 9.5 seconds  TODAY'S TREATMENT:                                                                                                                              DATE:   03/18/22 Therapeutic Exercise: to improve strength and mobility.  Demo, verbal and tactile cues throughout for  technique. UBE L2.0 x 6 min NDI score: 4/50 8% S/L open books 10x3" both ways - cues to rotate trunk along with shoulders Cat/cow quadruped 10x3"  Bird dog 10x2"- cues for neutral spine Thoracic hugs on foam roll 10x3" both ways Horizontal ABD on foam roll GTB x 10  Alt diagonals GTB x 10 bil  03/16/2022 Therapeutic Exercise: to improve strength and mobility.  Demo, verbal and tactile cues throughout for technique. UBE L1.5 x 6 min (39f/3b) Open book stretches s/l x 10 bil  Cat/cow stretch x 10 Child pose stretch x 3 Bird dogs x 10  Thoracic stretch over foam roller  I, Y, T's on foam roll x 10  Quarter squats with foam roller on wall x 10 Manual Therapy: to decrease muscle spasm and pain and improve mobility IASTM with foam roller to bil glutes and lumbar paraspinals.   03/04/2022 Therapeutic Exercise: to improve strength and mobility.  Demo, verbal and tactile cues throughout for technique. UBE L1.0 3 min fwd/ 3 min back Chin tucks seated 10x3" Reviewed remainder of HEP Standing horiz ABD RTB 2x10  Standing shld extension RTB 2x10  I,T, on foam roll x 10 bil Supine chest press on foam roll x 10  Supine thoracic hugs on foam roll x 10 Lat pulls and rows 25# x 10 each  02/28/2022 Therapeutic Exercise: to improve strength and mobility.  Demo, verbal and tactile cues throughout  for technique.  - Seated Cervical Retraction  - 10 reps - Seated Scapular Retraction   - 10 reps - 3 sec  hold - Standing Backward Shoulder Rolls  - 10 reps - Standing Thoracic Open Book at [place]  - 10 reps bil - Gentle Levator Scapulae Stretch  - 3 reps - 10 sec  hold bil   PATIENT EDUCATION:  Education details: HEP update, info on where to purchase foam roller Person educated: Patient Education method: Explanation, Demonstration, Verbal cues, and Handouts Education comprehension: verbalized understanding and returned demonstration  HOME EXERCISE PROGRAM: Access Code: [email or web address] URL:  [email or web address] Date: 03/16/2022 Prepared by: Glenetta Hew  Exercises - Seated Cervical Retraction  - 3 x daily - 7 x weekly - 1 sets - 10 reps - Seated Scapular Retraction  - 1 x daily - 7 x weekly - 3 sets - 10 reps - 3 sec  hold - Standing Backward Shoulder Rolls  - 3 x daily - 7 x weekly - 1 sets - 10 reps - Standing Thoracic Open Book at [place] 1 x daily - 7 x weekly - 1 sets - 10 reps - Gentle Levator Scapulae Stretch  - 1 x daily - 7 x weekly - 1 sets - 3 reps - 10 sec  hold - Standing Shoulder Horizontal Abduction with Anchored Resistance  - 1 x daily - 7 x weekly - 3 sets - 10 reps - Shoulder extension with resistance - Neutral  - 1 x daily - 7 x weekly - 3 sets - 10 reps - Supine Chest Stretch on Foam Roll  - 1 x daily - 7 x weekly - 1 sets - 10 reps - 5 sec hold - Thoracic Mobilization on Foam Roll  - 1 x daily - 7 x weekly - 1 sets - 10 reps - Sidelying Open Book Thoracic Lumbar Rotation and Extension  - 1 x daily - 7 x weekly - 1 sets - 10 reps - Standing Thoracic Open Book at [place]  - 1 x daily - 7 x weekly - 1 sets - 10 reps - Cat Cow  - 1 x daily - 7 x weekly - 1 sets - 10 reps - Child's Pose Stretch  - 1 x daily - 7 x weekly - 1 sets - 3 reps - 30 sec hold - Bird Dog  - 1 x daily - 7 x weekly - 3 sets - 10 reps - Wall Quarter Squat with Foam Roller  - 1 x daily - 7 x weekly - 2 sets - 10 reps   ASSESSMENT:  CLINICAL IMPRESSION: Pt demonstrated improvement in NDI score today with 2% improvement. She reports not much improvement in neck pain but also that she did not have much neck pain to begin with so does not know how to gauge improvement. She tolerated the exercises well with cues given throughout session to target the correct muscles, pt is progressing toward goals.   OBJECTIVE IMPAIRMENTS: decreased mobility, decreased ROM, increased fascial restrictions, increased muscle spasms, postural dysfunction, and pain.   ACTIVITY LIMITATIONS:  carrying, lifting, bending, standing, and sleeping  PARTICIPATION LIMITATIONS: community activity and occupation  PERSONAL FACTORS: Time since onset of injury/illness/exacerbation are also affecting patient's functional outcome.   REHAB POTENTIAL: Good  CLINICAL DECISION MAKING: Stable/uncomplicated  EVALUATION COMPLEXITY: Low   GOALS: Goals reviewed with patient? Yes  SHORT TERM GOALS: Target date: 03/14/2022   Patient will be independent with initial HEP.  Baseline: given Goal status:  MET 03/16/22  LONG TERM GOALS: Target date: 04/11/2022   Patient will be independent with advanced/ongoing HEP to improve outcomes and carryover.  Baseline: needs progression  Goal status: IN PROGRESS  2.  Patient will report 75% improvement in neck pain to improve QOL.  Baseline:  Goal status: IN PROGRESS - 03/18/22 patient only reports 30% improvement in pain, however she reports she did not have much neck pain to start with   3.  Patient will demonstrate full pain free cervical ROM for safety with driving.  Baseline: see objective, tightness flexion/extension Goal status: IN PROGRESS  4.  Patient will report 0% impairment on NDI  to demonstrate improved functional ability.  Baseline: 10% impairment Goal status: IN PROGRESS - 03/18/22  5.  Patient will demonstrate improved posture to decrease muscle imbalance. Baseline: forward head posture Goal status: IN PROGRESS  PLAN:  PT FREQUENCY: 1-2x/week  PT DURATION: 6 weeks  PLANNED INTERVENTIONS: Therapeutic exercises, Therapeutic activity, Neuromuscular re-education, Balance training, Gait training, Patient/Family education, Self Care, Joint mobilization, Dry Needling, Electrical stimulation, Spinal mobilization, Cryotherapy, Moist heat, Ultrasound, Manual therapy, and Re-evaluation  PLAN FOR NEXT SESSION: continue to progress strengthening, modalities PRN, interested in dry needling.    Artist Pais, PTA 03/18/2022, 9:34 AM

## 2022-03-21 ENCOUNTER — Ambulatory Visit: Payer: BC Managed Care – PPO | Admitting: Physical Therapy

## 2022-03-22 ENCOUNTER — Encounter: Payer: Self-pay | Admitting: Physical Therapy

## 2022-03-22 ENCOUNTER — Ambulatory Visit: Payer: BC Managed Care – PPO | Admitting: Physical Therapy

## 2022-03-22 DIAGNOSIS — R293 Abnormal posture: Secondary | ICD-10-CM

## 2022-03-22 DIAGNOSIS — M542 Cervicalgia: Secondary | ICD-10-CM | POA: Diagnosis not present

## 2022-03-22 NOTE — Therapy (Signed)
OUTPATIENT PHYSICAL THERAPY TREATMENT   Patient Name: Julie Michael MRN: [id number] DOB:1995/07/06, 27 y.o., female Today's Date: 03/22/2022  END OF SESSION:  PT End of Session - 03/22/22 0917     Visit Number 5    Number of Visits 6    Date for PT Re-Evaluation 04/11/22    Authorization Type BC[employer]    PT Start Time [phone]    PT Stop Time 0925    PT Time Calculation (min) 32 min    Activity Tolerance Patient tolerated treatment well    Behavior During Therapy [facility] for tasks assessed/performed               Past Medical History:  Diagnosis Date   Herpes    Yeast infection    History reviewed. No pertinent surgical history. Patient Active Problem List   Diagnosis Date Noted   BV (bacterial vaginosis) 12/21/2018   Vagina, candidiasis 12/21/2018   Genital lesion, female 12/21/2018   Exposure to COVID-19 virus 09/11/2018   Strain of abdominal muscle 03/30/2018   Yeast infection 03/30/2018   Healthcare maintenance 07/28/2017   Screen for STD (sexually transmitted disease) 07/28/2017   Discharge from the vagina 09/08/2016   Routine screening for STI (sexually transmitted infection) 09/08/2016   Contraception 09/08/2016   Amenorrhea 04/13/2016   Genital warts 01/23/2015   Sinusitis, chronic 08/09/2013   Vaginal discharge 12/05/2011   VISUAL ACUITY, DECREASED, LEFT EYE 05/26/2009    PCP: Fayette Pho, MD  REFERRING PROVIDER: Santiago Glad, MD   REFERRING DIAG: N62 (ICD-10-CM) - Symptomatic mammary hypertrophy   THERAPY DIAG:  Cervicalgia  Abnormal posture  Rationale for Evaluation and Treatment: Rehabilitation  ONSET DATE: 10+ years  SUBJECTIVE:                                                                                                                                                                                                         SUBJECTIVE STATEMENT: Pt reports no changes in symptoms. No new complaints  PERTINENT HISTORY:  No  significant medical history  PAIN:  Are you having pain? No  PRECAUTIONS: None  WEIGHT BEARING RESTRICTIONS: No  FALLS:  Has patient fallen in last 6 months? No  LIVING ENVIRONMENT: Lives with: lives with their family Lives in: House/apartment Stairs: No Has following equipment at home: None  OCCUPATION: Holiday representative  - stands all day  PLOF: Independent, works with Psychologist, educational 2x/week  PATIENT GOALS: qualify for breast reduction surgery, reduce the pain  NEXT MD VISIT: will schedule for 6 weeks from now  OBJECTIVE:  DIAGNOSTIC FINDINGS:  No relevant imaging  PATIENT SURVEYS:  NDI 5/50= 10% disability   COGNITION: Overall cognitive status: Within functional limits for tasks assessed  SENSATION: WFL  POSTURE: rounded shoulders, forward head, and increased thoracic kyphosis  PALPATION: Tenderness/tightness/trigger points throughout cervical paraspinals, bil UT and levator scapulae.    CERVICAL ROM:   Active ROM AROM (deg) eval  Flexion 40  Extension 30  Right lateral flexion 44  Left lateral flexion 40  Right rotation 75  Left rotation 70   (Blank rows = not tested)  UPPER EXTREMITY MMT:  MMT Right eval Left eval  Shoulder flexion 5 5  Shoulder extension    Shoulder abduction 5 5  Shoulder adduction    Shoulder internal rotation 5 5  Shoulder external rotation 5 5  Elbow flexion 5 5  Elbow extension 5 5  Wrist flexion 5 5  Wrist extension 5 5  Grip strong strong   (Blank rows = not tested)  UPPER EXTREMITY ROM:  WNL, symmetric, reports tightness no pain    CERVICAL SPECIAL TESTS:  NT  FUNCTIONAL TESTS:  5 times sit to stand: 9.5 seconds  TODAY'S TREATMENT:                                                                                                                              DATE:   [facility] Adult PT Treatment:                                                DATE: 03/22/22 Therapeutic Exercise: UBE L2 x 6 min alt fwd/bkwd Cat/cow 2 x  10 Open book x 10 bilat Thoracic hug on foam roll x 10 I,T,Y on foam roll x 10 each Horizontal abd 2 x 10 green TB on foam roll Diagonals on foam roll x 10 green TB Row 2 x 10 25# Lat pull down 25# 2 x 10 Wall push up plus 2 x 10 - cues for technique Doorway stretch 2 x 30 sec Sidebending stretch in doorway 2 x 20 sec  03/18/22 Therapeutic Exercise: to improve strength and mobility.  Demo, verbal and tactile cues throughout for technique. UBE L2.0 x 6 min NDI score: 4/50 8% S/L open books 10x3" both ways - cues to rotate trunk along with shoulders Cat/cow quadruped 10x3"  Bird dog 10x2"- cues for neutral spine Thoracic hugs on foam roll 10x3" both ways Horizontal ABD on foam roll GTB x 10  Alt diagonals GTB x 10 bil  03/16/2022 Therapeutic Exercise: to improve strength and mobility.  Demo, verbal and tactile cues throughout for technique. UBE L1.5 x 6 min (70f/3b) Open book stretches s/l x 10 bil  Cat/cow stretch x 10 Child pose stretch x 3 Bird dogs x 10  Thoracic stretch over foam roller  I, Y, T's on  foam roll x 10  Quarter squats with foam roller on wall x 10 Manual Therapy: to decrease muscle spasm and pain and improve mobility IASTM with foam roller to bil glutes and lumbar paraspinals.   03/04/2022 Therapeutic Exercise: to improve strength and mobility.  Demo, verbal and tactile cues throughout for technique. UBE L1.0 3 min fwd/ 3 min back Chin tucks seated 10x3" Reviewed remainder of HEP Standing horiz ABD RTB 2x10  Standing shld extension RTB 2x10  I,T, on foam roll x 10 bil Supine chest press on foam roll x 10  Supine thoracic hugs on foam roll x 10 Lat pulls and rows 25# x 10 each  02/28/2022 Therapeutic Exercise: to improve strength and mobility.  Demo, verbal and tactile cues throughout for technique.  - Seated Cervical Retraction  - 10 reps - Seated Scapular Retraction   - 10 reps - 3 sec  hold - Standing Backward Shoulder Rolls  - 10 reps - Standing  Thoracic Open Book at Wall  - 10 reps bil - Gentle Levator Scapulae Stretch  - 3 reps - 10 sec  hold bil   PATIENT EDUCATION:  Education details: HEP update, info on where to purchase foam roller Person educated: Patient Education method: Explanation, Demonstration, Verbal cues, and Handouts Education comprehension: verbalized understanding and returned demonstration  HOME EXERCISE PROGRAM: Access Code: [email or web address] URL: [email or web address] Date: 03/16/2022 Prepared by: Harrie Foreman  Exercises - Seated Cervical Retraction  - 3 x daily - 7 x weekly - 1 sets - 10 reps - Seated Scapular Retraction  - 1 x daily - 7 x weekly - 3 sets - 10 reps - 3 sec  hold - Standing Backward Shoulder Rolls  - 3 x daily - 7 x weekly - 1 sets - 10 reps - Standing Thoracic Open Book at Wall  - 1 x daily - 7 x weekly - 1 sets - 10 reps - Gentle Levator Scapulae Stretch  - 1 x daily - 7 x weekly - 1 sets - 3 reps - 10 sec  hold - Standing Shoulder Horizontal Abduction with Anchored Resistance  - 1 x daily - 7 x weekly - 3 sets - 10 reps - Shoulder extension with resistance - Neutral  - 1 x daily - 7 x weekly - 3 sets - 10 reps - Supine Chest Stretch on Foam Roll  - 1 x daily - 7 x weekly - 1 sets - 10 reps - 5 sec hold - Thoracic Mobilization on Foam Roll  - 1 x daily - 7 x weekly - 1 sets - 10 reps - Sidelying Open Book Thoracic Lumbar Rotation and Extension  - 1 x daily - 7 x weekly - 1 sets - 10 reps - Standing Thoracic Open Book at Wall  - 1 x daily - 7 x weekly - 1 sets - 10 reps - Cat Cow  - 1 x daily - 7 x weekly - 1 sets - 10 reps - Child's Pose Stretch  - 1 x daily - 7 x weekly - 1 sets - 3 reps - 30 sec hold - Bird Dog  - 1 x daily - 7 x weekly - 3 sets - 10 reps - Wall Quarter Squat with Foam Roller  - 1 x daily - 7 x weekly - 2 sets - 10 reps   ASSESSMENT:  CLINICAL IMPRESSION: Pt continues to progress towards goals. Added wall push up plus for continued mid/upper back  strengthening. Good tolerance to all exercises  OBJECTIVE IMPAIRMENTS: decreased mobility, decreased ROM, increased fascial restrictions, increased muscle spasms, postural dysfunction, and pain.   ACTIVITY LIMITATIONS: carrying, lifting, bending, standing, and sleeping  PARTICIPATION LIMITATIONS: community activity and occupation  PERSONAL FACTORS: Time since onset of injury/illness/exacerbation are also affecting patient's functional outcome.   REHAB POTENTIAL: Good  CLINICAL DECISION MAKING: Stable/uncomplicated  EVALUATION COMPLEXITY: Low   GOALS: Goals reviewed with patient? Yes  SHORT TERM GOALS: Target date: 03/14/2022   Patient will be independent with initial HEP.  Baseline: given Goal status: MET 03/16/22  LONG TERM GOALS: Target date: 04/11/2022   Patient will be independent with advanced/ongoing HEP to improve outcomes and carryover.  Baseline: needs progression  Goal status: IN PROGRESS  2.  Patient will report 75% improvement in neck pain to improve QOL.  Baseline:  Goal status: IN PROGRESS - 03/18/22 patient only reports 30% improvement in pain, however she reports she did not have much neck pain to start with   3.  Patient will demonstrate full pain free cervical ROM for safety with driving.  Baseline: see objective, tightness flexion/extension Goal status: IN PROGRESS  4.  Patient will report 0% impairment on NDI  to demonstrate improved functional ability.  Baseline: 10% impairment Goal status: IN PROGRESS - 03/18/22  5.  Patient will demonstrate improved posture to decrease muscle imbalance. Baseline: forward head posture Goal status: IN PROGRESS  PLAN:  PT FREQUENCY: 1-2x/week  PT DURATION: 6 weeks  PLANNED INTERVENTIONS: Therapeutic exercises, Therapeutic activity, Neuromuscular re-education, Balance training, Gait training, Patient/Family education, Self Care, Joint mobilization, Dry Needling, Electrical stimulation, Spinal mobilization,  Cryotherapy, Moist heat, Ultrasound, Manual therapy, and Re-evaluation  PLAN FOR NEXT SESSION: continue to progress strengthening, modalities PRN, interested in dry needling.    Ivory Bail, PT 03/22/2022, 9:17 AM

## 2022-03-23 NOTE — Therapy (Signed)
OUTPATIENT PHYSICAL THERAPY TREATMENT   Patient Name: Julie Michael MRN: [id number] DOB:1995/11/17, 27 y.o., female Today's Date: 03/22/2022  END OF SESSION:  PT End of Session - 03/22/22 0917     Visit Number 5    Number of Visits 6    Date for PT Re-Evaluation 04/11/22    Authorization Type [employer]BS    PT Start Time [phone]    PT Stop Time 0925    PT Time Calculation (min) 32 min    Activity Tolerance Patient tolerated treatment well    Behavior During Therapy [facility] for tasks assessed/performed               Past Medical History:  Diagnosis Date   Herpes    Yeast infection    History reviewed. No pertinent surgical history. Patient Active Problem List   Diagnosis Date Noted   BV (bacterial vaginosis) 12/21/2018   Vagina, candidiasis 12/21/2018   Genital lesion, female 12/21/2018   Exposure to COVID-19 virus 09/11/2018   Strain of abdominal muscle 03/30/2018   Yeast infection 03/30/2018   Healthcare maintenance 07/28/2017   Screen for STD (sexually transmitted disease) 07/28/2017   Discharge from the vagina 09/08/2016   Routine screening for STI (sexually transmitted infection) 09/08/2016   Contraception 09/08/2016   Amenorrhea 04/13/2016   Genital warts 01/23/2015   Sinusitis, chronic 08/09/2013   Vaginal discharge 12/05/2011   VISUAL ACUITY, DECREASED, LEFT EYE 05/26/2009    PCP: Ezequiel Essex, MD  REFERRING PROVIDER: Camillia Herter, MD   REFERRING DIAG: N62 (ICD-10-CM) - Symptomatic mammary hypertrophy   THERAPY DIAG:  Cervicalgia  Abnormal posture  Rationale for Evaluation and Treatment: Rehabilitation  ONSET DATE: 10+ years  SUBJECTIVE:                                                                                                                                                                                                         SUBJECTIVE STATEMENT: ***  PERTINENT HISTORY:  No significant medical history  PAIN:  Are you having  pain? No  PRECAUTIONS: None  WEIGHT BEARING RESTRICTIONS: No  FALLS:  Has patient fallen in last 6 months? No  LIVING ENVIRONMENT: Lives with: lives with their family Lives in: House/apartment Stairs: No Has following equipment at home: None  OCCUPATION: Artist  - stands all day  PLOF: Independent, works with Clinical research associate 2x/week  PATIENT GOALS: qualify for breast reduction surgery, reduce the pain  NEXT MD VISIT: will schedule for 6 weeks from now  OBJECTIVE:   DIAGNOSTIC FINDINGS:  No relevant imaging  PATIENT SURVEYS:  NDI 5/50= 10% disability   COGNITION: Overall cognitive status: Within functional limits for tasks assessed  SENSATION: WFL  POSTURE: rounded shoulders, forward head, and increased thoracic kyphosis  PALPATION: Tenderness/tightness/trigger points throughout cervical paraspinals, bil UT and levator scapulae.    CERVICAL ROM:   Active ROM AROM (deg) eval  Flexion 40  Extension 30  Right lateral flexion 44  Left lateral flexion 40  Right rotation 75  Left rotation 70   (Blank rows = not tested)  UPPER EXTREMITY MMT:  MMT Right eval Left eval  Shoulder flexion 5 5  Shoulder extension    Shoulder abduction 5 5  Shoulder adduction    Shoulder internal rotation 5 5  Shoulder external rotation 5 5  Elbow flexion 5 5  Elbow extension 5 5  Wrist flexion 5 5  Wrist extension 5 5  Grip strong strong   (Blank rows = not tested)  UPPER EXTREMITY ROM:  WNL, symmetric, reports tightness no pain    CERVICAL SPECIAL TESTS:  NT  FUNCTIONAL TESTS:  5 times sit to stand: 9.5 seconds  TODAY'S TREATMENT:                                                                                                                              DATE:   03/24/22 Therapeutic Exercise: to improve strength and mobility.  Demo, verbal and tactile cues throughout for technique. UBE L2 x 6 min alt fwd/bkwd Cat/cow 2 x 10 Open book x 10 bilat Thoracic hug on  foam roll x 10 I,T,Y on foam roll x 10 each Horizontal abd 2 x 10 green TB on foam roll Diagonals on foam roll x 10 green TB Row 2 x 10 25# Lat pull down 25# 2 x 10 Wall push up plus 2 x 10 - cues for technique Doorway stretch 2 x 30 sec Sidebending stretch in doorway 2 x 20 sec     [facility] Adult PT Treatment:                                                DATE: 03/22/22 Therapeutic Exercise: UBE L2 x 6 min alt fwd/bkwd Cat/cow 2 x 10 Open book x 10 bilat Thoracic hug on foam roll x 10 I,T,Y on foam roll x 10 each Horizontal abd 2 x 10 green TB on foam roll Diagonals on foam roll x 10 green TB Row 2 x 10 25# Lat pull down 25# 2 x 10 Wall push up plus 2 x 10 - cues for technique Doorway stretch 2 x 30 sec Sidebending stretch in doorway 2 x 20 sec  03/18/22 Therapeutic Exercise: to improve strength and mobility.  Demo, verbal and tactile cues throughout for technique. UBE L2.0 x 6 min NDI score: 4/50 8% S/L open books 10x3" both  ways - cues to rotate trunk along with shoulders Cat/cow quadruped 10x3"  Bird dog 10x2"- cues for neutral spine Thoracic hugs on foam roll 10x3" both ways Horizontal ABD on foam roll GTB x 10  Alt diagonals GTB x 10 bil   PATIENT EDUCATION: *** Education details: HEP update, info on where to purchase foam roller Person educated: Patient Education method: Explanation, Demonstration, Verbal cues, and Handouts Education comprehension: verbalized understanding and returned demonstration  HOME EXERCISE PROGRAM: Access Code: [email or web address] URL: [email or web address] Date: 03/16/2022 Prepared by: Harrie Foreman  Exercises - Seated Cervical Retraction  - 3 x daily - 7 x weekly - 1 sets - 10 reps - Seated Scapular Retraction  - 1 x daily - 7 x weekly - 3 sets - 10 reps - 3 sec  hold - Standing Backward Shoulder Rolls  - 3 x daily - 7 x weekly - 1 sets - 10 reps - Standing Thoracic Open Book at Wall  - 1 x daily - 7 x weekly - 1 sets - 10  reps - Gentle Levator Scapulae Stretch  - 1 x daily - 7 x weekly - 1 sets - 3 reps - 10 sec  hold - Standing Shoulder Horizontal Abduction with Anchored Resistance  - 1 x daily - 7 x weekly - 3 sets - 10 reps - Shoulder extension with resistance - Neutral  - 1 x daily - 7 x weekly - 3 sets - 10 reps - Supine Chest Stretch on Foam Roll  - 1 x daily - 7 x weekly - 1 sets - 10 reps - 5 sec hold - Thoracic Mobilization on Foam Roll  - 1 x daily - 7 x weekly - 1 sets - 10 reps - Sidelying Open Book Thoracic Lumbar Rotation and Extension  - 1 x daily - 7 x weekly - 1 sets - 10 reps - Standing Thoracic Open Book at Wall  - 1 x daily - 7 x weekly - 1 sets - 10 reps - Cat Cow  - 1 x daily - 7 x weekly - 1 sets - 10 reps - Child's Pose Stretch  - 1 x daily - 7 x weekly - 1 sets - 3 reps - 30 sec hold - Bird Dog  - 1 x daily - 7 x weekly - 3 sets - 10 reps - Wall Quarter Squat with Foam Roller  - 1 x daily - 7 x weekly - 2 sets - 10 reps   ASSESSMENT:  CLINICAL IMPRESSION: ***   OBJECTIVE IMPAIRMENTS: decreased mobility, decreased ROM, increased fascial restrictions, increased muscle spasms, postural dysfunction, and pain.   ACTIVITY LIMITATIONS: carrying, lifting, bending, standing, and sleeping  PARTICIPATION LIMITATIONS: community activity and occupation  PERSONAL FACTORS: Time since onset of injury/illness/exacerbation are also affecting patient's functional outcome.   REHAB POTENTIAL: Good  CLINICAL DECISION MAKING: Stable/uncomplicated  EVALUATION COMPLEXITY: Low   GOALS: Goals reviewed with patient? Yes  SHORT TERM GOALS: Target date: 03/14/2022   Patient will be independent with initial HEP.  Baseline: given Goal status: MET 03/16/22  LONG TERM GOALS: Target date: 04/11/2022   Patient will be independent with advanced/ongoing HEP to improve outcomes and carryover.  Baseline: needs progression  Goal status: IN PROGRESS  2.  Patient will report 75% improvement in neck pain to  improve QOL.  Baseline:  Goal status: IN PROGRESS - 03/18/22 patient only reports 30% improvement in pain, however she reports she did not have much neck pain to start with  3.  Patient will demonstrate full pain free cervical ROM for safety with driving.  Baseline: see objective, tightness flexion/extension Goal status: IN PROGRESS  4.  Patient will report 0% impairment on NDI  to demonstrate improved functional ability.  Baseline: 10% impairment Goal status: IN PROGRESS - 03/18/22  5.  Patient will demonstrate improved posture to decrease muscle imbalance. Baseline: forward head posture Goal status: IN PROGRESS  PLAN:  PT FREQUENCY: 1-2x/week  PT DURATION: 6 weeks  PLANNED INTERVENTIONS: Therapeutic exercises, Therapeutic activity, Neuromuscular re-education, Balance training, Gait training, Patient/Family education, Self Care, Joint mobilization, Dry Needling, Electrical stimulation, Spinal mobilization, Cryotherapy, Moist heat, Ultrasound, Manual therapy, and Re-evaluation  PLAN FOR NEXT SESSION: continue to progress strengthening, modalities PRN, interested in dry needling.    DONAWERTH,KAREN, PT 03/22/2022, 9:17 AM

## 2022-03-24 ENCOUNTER — Ambulatory Visit: Payer: BC Managed Care – PPO | Admitting: Physical Therapy

## 2022-03-24 ENCOUNTER — Encounter: Payer: Self-pay | Admitting: Physical Therapy

## 2022-03-24 DIAGNOSIS — R293 Abnormal posture: Secondary | ICD-10-CM

## 2022-03-24 DIAGNOSIS — M542 Cervicalgia: Secondary | ICD-10-CM | POA: Diagnosis not present

## 2022-03-28 ENCOUNTER — Encounter: Payer: Self-pay | Admitting: Physical Therapy

## 2022-03-28 ENCOUNTER — Ambulatory Visit: Payer: BC Managed Care – PPO | Admitting: Physical Therapy

## 2022-03-28 DIAGNOSIS — M542 Cervicalgia: Secondary | ICD-10-CM | POA: Diagnosis not present

## 2022-03-28 DIAGNOSIS — R293 Abnormal posture: Secondary | ICD-10-CM

## 2022-03-28 NOTE — Therapy (Signed)
OUTPATIENT PHYSICAL THERAPY TREATMENT   Patient Name: Julie Michael MRN: [id number] DOB:February 05, 1996, 27 y.o., female Today's Date: 03/28/2022  END OF SESSION:  PT End of Session - 03/28/22 0859     Visit Number 7    Date for PT Re-Evaluation 04/11/22    Authorization Type [employer]    PT Start Time [phone]    PT Stop Time 0932    PT Time Calculation (min) 36 min    Activity Tolerance Patient tolerated treatment well    Behavior During Therapy [facility] for tasks assessed/performed               Past Medical History:  Diagnosis Date   Herpes    Yeast infection    History reviewed. No pertinent surgical history. Patient Active Problem List   Diagnosis Date Noted   BV (bacterial vaginosis) 12/21/2018   Vagina, candidiasis 12/21/2018   Genital lesion, female 12/21/2018   Exposure to COVID-19 virus 09/11/2018   Strain of abdominal muscle 03/30/2018   Yeast infection 03/30/2018   Healthcare maintenance 07/28/2017   Screen for STD (sexually transmitted disease) 07/28/2017   Discharge from the vagina 09/08/2016   Routine screening for STI (sexually transmitted infection) 09/08/2016   Contraception 09/08/2016   Amenorrhea 04/13/2016   Genital warts 01/23/2015   Sinusitis, chronic 08/09/2013   Vaginal discharge 12/05/2011   VISUAL ACUITY, DECREASED, LEFT EYE 05/26/2009    PCP: Ezequiel Essex, MD  REFERRING PROVIDER: Camillia Herter, MD   REFERRING DIAG: 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  Getting excited about pre-op  appointment.   Not having pain pain anymore, just the pain she is used to from pulling from heaviness of chest.   PERTINENT HISTORY:  No significant medical history  PAIN:  Are you having pain? No  tightness, pulling from chest.    PRECAUTIONS: None  WEIGHT BEARING RESTRICTIONS: No  FALLS:  Has patient fallen in last 6 months? 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    CERVICAL ROM:   Active ROM AROM (deg) eval  Flexion 40  Extension 30  Right lateral flexion 44  Left lateral flexion 40  Right rotation 75  Left rotation 70   (Blank rows = not tested)  UPPER EXTREMITY MMT:  MMT Right eval Left eval  Shoulder flexion 5 5  Shoulder extension    Shoulder abduction 5 5  Shoulder adduction    Shoulder internal rotation 5 5  Shoulder external rotation 5 5  Elbow flexion 5 5  Elbow extension 5 5  Wrist flexion 5 5  Wrist extension 5 5  Grip strong strong   (Blank rows = not tested)  UPPER EXTREMITY ROM:  WNL, symmetric, reports tightness no pain    CERVICAL SPECIAL TESTS:  NT  FUNCTIONAL TESTS:  5 times sit to stand: 9.5 seconds  TODAY'S TREATMENT:                                                                                                                               DATE:   03/28/22 Therapeutic Exercise: to improve strength and mobility.  Demo, verbal and tactile cues throughout for technique. UBE L2 x 6 min  Cat cows x 10  Bird dogs 3 x 10 with child pose between sets Supine PPT x 10 Bridges x 10  S/l open books x 10 bil  PPT x 10 - cues for breathing Bridges with TrA bracing x 10  Supine straight leg lower rotation stretch x 3 bil   03/24/22 Therapeutic Exercise: to improve strength and mobility.  Demo, verbal and tactile cues throughout for technique. UBE L2 x 6 min alt fwd/bkwd Cat/cow 1 x 10 Quadriped alt leg lift x 5 ea, then opp arm and leg x 5 ea Open book x 10 bilat Thoracic hug on foam roll x 10 I,T,Y on foam roll x 10 each Horizontal abd 2 x 10 green TB on foam roll Diagonals on foam roll x 10 green TB Row 2 x 10 35# vertical and horizontal Lat pull down 35# 2 x 10 Wall push up plus 2 x 10 (one set military style) Doorway stretch 2 x 30 sec Sidebending stretch in doorway 2 x 20 sec  [facility] Adult PT Treatment:  DATE: 03/22/22 Therapeutic Exercise: UBE L2 x 6 min alt fwd/bkwd Cat/cow 2 x 10 Open book x 10 bilat Thoracic hug on foam roll x 10 I,T,Y on foam roll x 10 each Horizontal abd 2 x 10 green TB on foam roll Diagonals on foam roll x 10 green TB Row 2 x 10 25#  Lat pull down 25# 2 x 10 Wall push up plus 2 x 10 - cues for technique Doorway stretch 2 x 30 sec Sidebending stretch in doorway 2 x 20 sec  PATIENT EDUCATION:  Education details: HEP update Person educated: Patient Education method: Explanation, Demonstration, Verbal cues, and Handouts Education comprehension: verbalized understanding and returned demonstration  HOME EXERCISE PROGRAM: Access Code: [email or web address] URL: [email or web address] Date: 03/28/2022 Prepared by: Harrie Foreman  Exercises Added   - Supine Straight Leg Lumbar Rotation Stretch  - 1 x daily - 7 x weekly - 3 sets - 10  reps - Supine Posterior Pelvic Tilt  - 1 x daily - 7 x weekly - 3 sets - 10 reps - Supine Bridge  - 1 x daily - 7 x weekly - 3 sets - 10 reps   ASSESSMENT:  CLINICAL IMPRESSION: Gavin reports decreased pain overall, exercises are helping.  Today discussed healing and exercising in post-op period, how to modify exercises and also started with PPT since this is an exercise for core that could be done after surgery without pulling on chest.  Abbeygail Igoe continues to demonstrate potential for improvement and would benefit from continued skilled therapy to address impairments.     OBJECTIVE IMPAIRMENTS: decreased mobility, decreased ROM, increased fascial restrictions, increased muscle spasms, postural dysfunction, and pain.   ACTIVITY LIMITATIONS: carrying, lifting, bending, standing, and sleeping  PARTICIPATION LIMITATIONS: community activity and occupation  PERSONAL FACTORS: Time since onset of injury/illness/exacerbation are also affecting patient's functional outcome.   REHAB POTENTIAL: Good  CLINICAL DECISION MAKING: Stable/uncomplicated  EVALUATION COMPLEXITY: Low   GOALS: Goals reviewed with patient? Yes  SHORT TERM GOALS: Target date: 03/14/2022   Patient will be independent with initial HEP.  Baseline: given Goal status: MET 03/16/22  LONG TERM GOALS: Target date: 04/11/2022   Patient will be independent with advanced/ongoing HEP to improve outcomes and carryover.  Baseline: needs progression  Goal status: IN PROGRESS  2.  Patient will report 75% improvement in neck pain to improve QOL.  Baseline:  Goal status: IN PROGRESS - 03/18/22 patient only reports 30% improvement in pain, however she reports she did not have much neck pain to start with   3.  Patient will demonstrate full pain free cervical ROM for safety with driving.  Baseline: see objective, tightness flexion/extension Goal status: IN PROGRESS  4.  Patient will report 0% impairment on NDI  to  demonstrate improved functional ability.  Baseline: 10% impairment Goal status: IN PROGRESS - 03/18/22  5.  Patient will demonstrate improved posture to decrease muscle imbalance. Baseline: forward head posture Goal status: IN PROGRESS  PLAN:  PT FREQUENCY: 1-2x/week  PT DURATION: 6 weeks  PLANNED INTERVENTIONS: Therapeutic exercises, Therapeutic activity, Neuromuscular re-education, Balance training, Gait training, Patient/Family education, Self Care, Joint mobilization, Dry Needling, Electrical stimulation, Spinal mobilization, Cryotherapy, Moist heat, Ultrasound, Manual therapy, and Re-evaluation  PLAN FOR NEXT SESSION: continue to progress strengthening, modalities PRN   Jena Gauss, PT, DPT  03/28/2022, 10:36 AM

## 2022-03-31 ENCOUNTER — Ambulatory Visit: Payer: BC Managed Care – PPO

## 2022-03-31 DIAGNOSIS — M542 Cervicalgia: Secondary | ICD-10-CM

## 2022-03-31 DIAGNOSIS — R293 Abnormal posture: Secondary | ICD-10-CM

## 2022-03-31 NOTE — Therapy (Signed)
OUTPATIENT PHYSICAL THERAPY TREATMENT   Patient Name: Julie Michael MRN: [id number] DOB:1995/12/28, 27 y.o., female Today's Date: 03/31/2022  END OF SESSION:  PT End of Session - 03/31/22 0847     Visit Number 8    Date for PT Re-Evaluation 04/11/22    Authorization Type BCBS    PT Start Time 0813   pt arrived late   PT Stop Time 0846    PT Time Calculation (min) 33 min    Activity Tolerance Patient tolerated treatment well    Behavior During Therapy [facility] for tasks assessed/performed                Past Medical History:  Diagnosis Date   Herpes    Yeast infection    History reviewed. No pertinent surgical history. Patient Active Problem List   Diagnosis Date Noted   BV (bacterial vaginosis) 12/21/2018   Vagina, candidiasis 12/21/2018   Genital lesion, female 12/21/2018   Exposure to COVID-19 virus 09/11/2018   Strain of abdominal muscle 03/30/2018   Yeast infection 03/30/2018   Healthcare maintenance 07/28/2017   Screen for STD (sexually transmitted disease) 07/28/2017   Discharge from the vagina 09/08/2016   Routine screening for STI (sexually transmitted infection) 09/08/2016   Contraception 09/08/2016   Amenorrhea 04/13/2016   Genital warts 01/23/2015   Sinusitis, chronic 08/09/2013   Vaginal discharge 12/05/2011   VISUAL ACUITY, DECREASED, LEFT EYE 05/26/2009    PCP: Ezequiel Essex, MD  REFERRING PROVIDER: Camillia Herter, MD   REFERRING DIAG: 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  PERTINENT HISTORY:  No significant medical history  PAIN:  Are you having pain? No    PRECAUTIONS: None  WEIGHT BEARING RESTRICTIONS: No  FALLS:  Has patient fallen in last 6 months? 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    CERVICAL ROM:   Active ROM AROM (deg) eval  Flexion 40  Extension 30  Right lateral flexion 44  Left lateral flexion 40  Right rotation 75  Left rotation 70   (Blank rows = not tested)  UPPER EXTREMITY MMT:  MMT Right eval Left eval  Shoulder flexion 5 5  Shoulder extension    Shoulder abduction 5 5  Shoulder adduction    Shoulder internal rotation 5 5  Shoulder external rotation 5 5  Elbow flexion 5 5  Elbow extension 5 5  Wrist flexion 5 5  Wrist extension 5 5  Grip strong strong   (Blank rows = not tested)  UPPER EXTREMITY ROM:  WNL, symmetric, reports tightness no pain    CERVICAL SPECIAL TESTS:  NT  FUNCTIONAL TESTS:  5 times sit to stand: 9.5 seconds  TODAY'S TREATMENT:                                                                                                                              DATE:   03/31/22 Therapeutic Exercise: to improve strength and mobility.  Demo, verbal and tactile cues throughout for  technique. UBE L3 x 2 min fwd/ 2 min back Standing open book x 10 Supine PPT 2x10 Bridge 2x10 5 sec hold at top SLR bil 2x10 with TrA  Dead bug x 10 bil  Cat/cow x 10 Bird dog x 10  Functional squat 2x10 against wall Standing hip abd to ext 1/2 circle x 10   03/28/22 Therapeutic Exercise: to improve strength and mobility.  Demo, verbal and tactile cues throughout for technique. UBE L2 x 6 min  Cat cows x 10  Bird dogs 3 x 10 with child pose between sets Supine PPT x 10 Bridges x 10  S/l open books x 10 bil  PPT x 10 - cues for breathing Bridges with TrA bracing x 10  Supine straight leg lower rotation stretch x 3 bil   03/24/22 Therapeutic Exercise: to improve strength and mobility.  Demo, verbal and tactile cues throughout for technique. UBE L2 x 6 min alt fwd/bkwd Cat/cow 1 x 10 Quadriped alt leg lift x 5 ea, then opp arm and leg x 5 ea Open book x 10 bilat Thoracic hug on foam roll x 10 I,T,Y on foam roll x 10 each Horizontal abd 2 x 10 green TB on foam roll Diagonals on foam roll x 10 green TB Row 2 x 10 35# vertical and horizontal Lat pull down 35# 2 x 10 Wall push up plus 2 x 10 (one set [employer])  Doorway stretch 2 x 30 sec Sidebending stretch in doorway 2 x 20 sec   PATIENT EDUCATION:  Education details: HEP update Person educated: Patient Education method: Explanation, Demonstration, Verbal cues, and Handouts Education comprehension: verbalized understanding and returned demonstration  HOME EXERCISE PROGRAM: Access Code: [email or web address] URL: [email or web address] Date: 03/28/2022 Prepared by: Glenetta Hew  Exercises Added   - Supine Straight Leg Lumbar Rotation Stretch  - 1 x daily - 7 x weekly - 3 sets - 10 reps - Supine Posterior Pelvic Tilt  - 1 x daily - 7 x weekly - 3 sets - 10 reps - Supine Bridge  - 1 x daily - 7 x weekly - 3 sets - 10 reps   ASSESSMENT:  CLINICAL IMPRESSION: Pt demonstrated a good tolerance for exercises.  Continued progressing lumbopelvic mobility and stabilization. She needed cues and reminders throughout session with exercises to stabilize core. Pt had no increased pain with exercises but did note tightness from using her lumbar muscles. Caitlinn Klinker continues to demonstrate potential for improvement and would benefit from continued skilled therapy to address impairments.     OBJECTIVE IMPAIRMENTS: decreased mobility, decreased ROM, increased fascial restrictions, increased muscle spasms, postural dysfunction, and pain.   ACTIVITY LIMITATIONS: carrying, lifting, bending, standing, and sleeping  PARTICIPATION LIMITATIONS: community activity and occupation  PERSONAL FACTORS: Time since onset of injury/illness/exacerbation are also affecting patient's functional outcome.   REHAB POTENTIAL: Good  CLINICAL DECISION MAKING: Stable/uncomplicated  EVALUATION COMPLEXITY: Low   GOALS: Goals reviewed with patient? Yes  SHORT TERM GOALS: Target date: 03/14/2022   Patient will be independent with initial HEP.  Baseline: given Goal status: MET 03/16/22  LONG TERM GOALS: Target date: 04/11/2022   Patient will be independent with advanced/ongoing HEP to improve outcomes and carryover.  Baseline: needs progression  Goal status: IN PROGRESS  2.  Patient will report 75% improvement in neck pain to improve QOL.  Baseline:  Goal status: IN PROGRESS - 03/18/22 patient only reports 30% improvement in pain, however she reports she did not have much neck pain to start with   3.  Patient will demonstrate full pain free cervical ROM for safety with driving.  Baseline: see objective, tightness flexion/extension Goal status: IN PROGRESS  4.  Patient will report 0% impairment on NDI  to demonstrate improved functional ability.  Baseline: 10% impairment Goal status: IN PROGRESS - 03/18/22  5.  Patient will demonstrate improved posture to decrease muscle imbalance. Baseline: forward head posture Goal  status: IN PROGRESS  PLAN:  PT FREQUENCY: 1-2x/week  PT DURATION: 6 weeks  PLANNED INTERVENTIONS: Therapeutic exercises, Therapeutic activity, Neuromuscular re-education, Balance training, Gait training, Patient/Family education, Self Care, Joint mobilization, Dry Needling, Electrical stimulation, Spinal mobilization, Cryotherapy, Moist heat, Ultrasound, Manual therapy, and Re-evaluation  PLAN FOR NEXT SESSION: continue to progress strengthening, modalities PRN   Artist Pais, PTA 03/31/2022, 8:48 AM

## 2022-04-04 ENCOUNTER — Telehealth: Payer: Self-pay | Admitting: *Deleted

## 2022-04-04 ENCOUNTER — Ambulatory Visit: Payer: BC Managed Care – PPO

## 2022-04-04 DIAGNOSIS — R293 Abnormal posture: Secondary | ICD-10-CM

## 2022-04-04 DIAGNOSIS — M542 Cervicalgia: Secondary | ICD-10-CM | POA: Diagnosis not present

## 2022-04-04 NOTE — Telephone Encounter (Signed)
Call received from RN with [employer] wanting to confirm pt's dates of surgery re: STD claim. Informed that pt does not have a surgery date set at this time. States they will f/u at a future date

## 2022-04-04 NOTE — Therapy (Signed)
OUTPATIENT PHYSICAL THERAPY TREATMENT   Patient Name: Julie Michael MRN: [id number] DOB:29-Nov-1995, 27 y.o., female Today's Date: 04/04/2022  END OF SESSION:  PT End of Session - 04/04/22 0820     Visit Number 9    Date for PT Re-Evaluation 04/11/22    Authorization Type [employer]BS    PT Start Time 0808   pt late   PT Stop Time 0844    PT Time Calculation (min) 36 min    Activity Tolerance Patient tolerated treatment well    Behavior During Therapy [facility] for tasks assessed/performed                 Past Medical History:  Diagnosis Date   Herpes    Yeast infection    History reviewed. No pertinent surgical history. Patient Active Problem List   Diagnosis Date Noted   BV (bacterial vaginosis) 12/21/2018   Vagina, candidiasis 12/21/2018   Genital lesion, female 12/21/2018   Exposure to COVID-19 virus 09/11/2018   Strain of abdominal muscle 03/30/2018   Yeast infection 03/30/2018   Healthcare maintenance 07/28/2017   Screen for STD (sexually transmitted disease) 07/28/2017   Discharge from the vagina 09/08/2016   Routine screening for STI (sexually transmitted infection) 09/08/2016   Contraception 09/08/2016   Amenorrhea 04/13/2016   Genital warts 01/23/2015   Sinusitis, chronic 08/09/2013   Vaginal discharge 12/05/2011   VISUAL ACUITY, DECREASED, LEFT EYE 05/26/2009    PCP: Ezequiel Essex, MD  REFERRING PROVIDER: Camillia Herter, MD   REFERRING DIAG: 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  PERTINENT  HISTORY:  No significant medical history  PAIN:  Are you having pain? No    PRECAUTIONS: None  WEIGHT BEARING RESTRICTIONS: No  FALLS:  Has patient fallen in last 6 months? 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    CERVICAL ROM:   Active ROM AROM (deg) eval  Flexion 40  Extension 30  Right lateral flexion 44  Left lateral flexion 40  Right rotation 75  Left rotation 70   (Blank rows = not tested)  UPPER EXTREMITY MMT:  MMT Right eval Left eval  Shoulder flexion 5 5  Shoulder extension    Shoulder abduction 5 5  Shoulder adduction    Shoulder internal rotation 5 5  Shoulder external rotation 5 5  Elbow flexion 5 5  Elbow extension 5 5  Wrist flexion 5 5  Wrist extension 5 5  Grip strong strong   (Blank rows = not tested)  UPPER EXTREMITY ROM:  WNL, symmetric, reports tightness no pain    CERVICAL SPECIAL TESTS:  NT  FUNCTIONAL TESTS:  5 times sit to stand: 9.5 seconds  TODAY'S TREATMENT:                                                                                                                              DATE:   04/04/22 Therapeutic Exercise: to improve strength and mobility.  Demo, verbal and tactile cues throughout for technique. UBE L3 x 3  min fwd/ 3 min back S/L open book x 10 bil Cat/cow x 10 bil Bridges with hip ADD ball squeeze 10x5" Bridges with GTB 10x5" Bird dog GTB 2x10 - 5 sec hold Wall squat 2x10  03/31/22 Therapeutic Exercise: to improve strength and mobility.  Demo, verbal and tactile cues throughout for technique. UBE L3 x 2 min fwd/ 2 min back Standing open book x 10 Supine PPT 2x10 Bridge 2x10 5 sec hold at top SLR bil 2x10 with TrA  Dead bug x 10 bil  Cat/cow x 10 Bird dog x 10  Functional squat 2x10 against wall Standing hip abd to ext 1/2 circle x 10   03/28/22 Therapeutic Exercise: to improve strength and mobility.  Demo, verbal and tactile cues throughout for technique. UBE L2 x 6 min  Cat cows x 10  Bird dogs 3 x 10 with child pose between sets Supine PPT x 10 Bridges x 10  S/l open books x 10 bil  PPT x 10 - cues for breathing Bridges with TrA bracing x 10  Supine straight leg lower rotation stretch x 3 bil    PATIENT EDUCATION:  Education details: HEP update Person educated: Patient Education method: Consulting civil engineer, Demonstration, Verbal cues, and Handouts Education comprehension: verbalized understanding and returned demonstration  HOME EXERCISE PROGRAM: Access Code: [email or web address]   ASSESSMENT:  CLINICAL IMPRESSION: Pt showed a good demonstration of exercises with min need for  cueing. She continues to be able to participate in interventions with no increased pain. Progressed bridges and bird dog with resistance band. Progressing well toward goals.    OBJECTIVE IMPAIRMENTS: decreased mobility, decreased ROM, increased fascial restrictions, increased muscle spasms, postural dysfunction, and pain.   ACTIVITY LIMITATIONS: carrying, lifting, bending, standing, and sleeping  PARTICIPATION LIMITATIONS: community activity and occupation  PERSONAL FACTORS: Time since onset of injury/illness/exacerbation are also affecting patient's functional outcome.   REHAB POTENTIAL: Good  CLINICAL  DECISION MAKING: Stable/uncomplicated  EVALUATION COMPLEXITY: Low   GOALS: Goals reviewed with patient? Yes  SHORT TERM GOALS: Target date: 03/14/2022   Patient will be independent with initial HEP.  Baseline: given Goal status: MET 03/16/22  LONG TERM GOALS: Target date: 04/11/2022   Patient will be independent with advanced/ongoing HEP to improve outcomes and carryover.  Baseline: needs progression  Goal status: IN PROGRESS  2.  Patient will report 75% improvement in neck pain to improve QOL.  Baseline:  Goal status: IN PROGRESS - 03/18/22 patient only reports 30% improvement in pain, however she reports she did not have much neck pain to start with   3.  Patient will demonstrate full pain free cervical ROM for safety with driving.  Baseline: see objective, tightness flexion/extension Goal status: IN PROGRESS  4.  Patient will report 0% impairment on NDI  to demonstrate improved functional ability.  Baseline: 10% impairment Goal status: IN PROGRESS - 03/18/22  5.  Patient will demonstrate improved posture to decrease muscle imbalance. Baseline: forward head posture Goal status: IN PROGRESS  PLAN:  PT FREQUENCY: 1-2x/week  PT DURATION: 6 weeks  PLANNED INTERVENTIONS: Therapeutic exercises, Therapeutic activity, Neuromuscular re-education, Balance training, Gait training, Patient/Family education, Self Care, Joint mobilization, Dry Needling, Electrical stimulation, Spinal mobilization, Cryotherapy, Moist heat, Ultrasound, Manual therapy, and Re-evaluation  PLAN FOR NEXT SESSION: continue to progress strengthening, review and finalize HEP;re-check goals; modalities PRN   Darleene Cleaver, PTA 04/04/2022, 8:57 AM

## 2022-04-06 NOTE — Therapy (Signed)
OUTPATIENT PHYSICAL THERAPY TREATMENT   Patient Name: Julie Michael MRN: [id number] DOB:April 26, 1995, 27 y.o., female Today's Date: 04/07/2022  END OF SESSION:  PT End of Session - 04/07/22 0804     Visit Number 10    Date for PT Re-Evaluation 04/11/22    Authorization Type [employer]    PT Start Time 0802    PT Stop Time 0840    PT Time Calculation (min) 38 min    Activity Tolerance Patient tolerated treatment well    Behavior During Therapy WFL for tasks assessed/performed                  Past Medical History:  Diagnosis Date   Herpes    Yeast infection    History reviewed. No pertinent surgical history. Patient Active Problem List   Diagnosis Date Noted   BV (bacterial vaginosis) 12/21/2018   Vagina, candidiasis 12/21/2018   Genital lesion, female 12/21/2018   Exposure to COVID-19 virus 09/11/2018   Strain of abdominal muscle 03/30/2018   Yeast infection 03/30/2018   Healthcare maintenance 07/28/2017   Screen for STD (sexually transmitted disease) 07/28/2017   Discharge from the vagina 09/08/2016   Routine screening for STI (sexually transmitted infection) 09/08/2016   Contraception 09/08/2016   Amenorrhea 04/13/2016   Genital warts 01/23/2015   Sinusitis, chronic 08/09/2013   Vaginal discharge 12/05/2011   VISUAL ACUITY, DECREASED, LEFT EYE 05/26/2009    PCP: Julie Pho, MD  REFERRING PROVIDER: Santiago Glad, MD   REFERRING DIAG: 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  PERTINENT HISTORY:  No significant medical history  PAIN:   Are you having pain? Yes: NPRS scale: 4/10 Pain location: upper/midback/shoulders Pain description: ache Aggravating factors: poor posture Relieving factors: nothing   PRECAUTIONS: None  WEIGHT BEARING RESTRICTIONS: No  FALLS:  Has patient fallen in last 6 months? No  LIVING ENVIRONMENT: Lives with: lives with their family Lives in: House/apartment Stairs: No Has following equipment at home: None  OCCUPATION: Holiday representative  - stands all day  PLOF: Independent, works with  trainer 2x/week  PATIENT GOALS: qualify for breast reduction surgery, reduce the pain  NEXT MD VISIT: will schedule for 6 weeks from now  OBJECTIVE:   DIAGNOSTIC FINDINGS:  No relevant imaging  PATIENT SURVEYS:  NDI 5/50= 10% disability   COGNITION: Overall cognitive status: Within functional limits for tasks assessed  SENSATION: WFL  POSTURE: rounded shoulders, forward head, and increased thoracic kyphosis  PALPATION: Tenderness/tightness/trigger points throughout cervical paraspinals, bil UT and levator scapulae.    CERVICAL ROM:   Active ROM AROM (deg) eval  Flexion 40  Extension 30  Right lateral flexion 44  Left lateral flexion 40  Right rotation 75  Left rotation 70   (Blank rows = not tested)  UPPER EXTREMITY MMT:  MMT Right eval Left eval  Shoulder flexion 5 5  Shoulder extension    Shoulder abduction 5 5  Shoulder adduction    Shoulder internal rotation 5 5  Shoulder external rotation 5 5  Elbow flexion 5 5  Elbow extension 5 5  Wrist flexion 5 5  Wrist extension 5 5  Grip strong strong   (Blank rows = not tested)  UPPER EXTREMITY ROM:  WNL, symmetric, reports tightness no pain    CERVICAL SPECIAL TESTS:  NT  FUNCTIONAL TESTS:  5 times sit to stand: 9.5 seconds  TODAY'S TREATMENT:                                                                                                                               DATE:   04/07/22 Therapeutic Exercise: to improve strength and mobility.  Demo, verbal and tactile cues throughout for technique. UBE L3 x 3 min fwd/ 4 min back Lat pull 25# 2x10 Rows 25# 2x10  one set vert and horizontal Wall squat 1x10 with 10# weights in each hand Functional squat x 10# Farmer carry 10# in each hand x 20 feet each OH 10# one arm with marching in place x 10 each side for anti lateral challenge Lunge position with D2 shoulder ext 5# x 10 ea S/L open book x 10 bil Dying bug x 5 each side Table top 90/90 with OH lat pull 10# x 10, then with alt leg press (dying bug) x 5 each side Bridges with hip ADD ball squeeze 2x5 with alt knee ext  Julie Michael with GTB x 10,  10x5" Bird dog 2x5 - with knee to elbow crunch   04/04/22 Therapeutic Exercise: to improve strength and mobility.  Demo, verbal and tactile cues throughout for technique. UBE L3 x 3 min fwd/ 3 min back S/L open book x 10 bil Cat/cow x 10 bil Bridges with hip ADD ball squeeze 10x5" Bridges with GTB 10x5" Bird dog GTB 2x10 - 5 sec hold Wall squat 2x10  03/31/22 Therapeutic Exercise: to improve strength and mobility.  Demo, verbal and tactile cues throughout for technique. UBE L3 x 2 min fwd/ 2 min back Standing open book x 10 Supine PPT  2x10 Bridge 2x10 5 sec hold at top SLR bil 2x10 with TrA  Dead bug x 10 bil  Cat/cow x 10 Bird dog x 10  Functional squat 2x10 against wall Standing hip abd to ext 1/2 circle x 10     PATIENT EDUCATION:  Education details: HEP update Person educated: Patient Education method: Explanation, Demonstration, Verbal cues, and Handouts Education comprehension: verbalized understanding and returned demonstration  HOME EXERCISE PROGRAM: Access Code: [email or web address]   ASSESSMENT:  CLINICAL IMPRESSION: Julie Michael did very well with more challenging core exercises. Quadriped most challenging today. Pain continues to be present in upper  and mid back despite strengthening and stretching. Plan to refer to back to MD after next visit.    OBJECTIVE IMPAIRMENTS: decreased mobility, decreased ROM, increased fascial restrictions, increased muscle spasms, postural dysfunction, and pain.   ACTIVITY LIMITATIONS: carrying, lifting, bending, standing, and sleeping  PARTICIPATION LIMITATIONS: community activity and occupation  PERSONAL FACTORS: Time since onset of injury/illness/exacerbation are also affecting patient's functional outcome.   REHAB POTENTIAL: Good  CLINICAL DECISION MAKING: Stable/uncomplicated  EVALUATION COMPLEXITY: Low   GOALS: Goals reviewed with patient? Yes  SHORT TERM GOALS: Target date: 03/14/2022   Patient will be independent with initial HEP.  Baseline: given Goal status: MET 03/16/22  LONG TERM GOALS: Target date: 04/11/2022   Patient will be independent with advanced/ongoing HEP to improve outcomes and carryover.  Baseline: needs progression  Goal status: IN PROGRESS  2.  Patient will report 75% improvement in neck pain to improve QOL.  Baseline:  Goal status: IN PROGRESS - 03/18/22 patient only reports 30% improvement in pain, however she reports she did not have much neck pain to start with   3.  Patient will demonstrate full pain free cervical ROM for safety with driving.  Baseline: see objective, tightness flexion/extension Goal status: IN PROGRESS  4.  Patient will report 0% impairment on NDI  to demonstrate improved functional ability.  Baseline: 10% impairment Goal status: IN PROGRESS - 03/18/22  5.  Patient will demonstrate improved posture to decrease muscle imbalance. Baseline: forward head posture Goal status: IN PROGRESS  PLAN:  PT FREQUENCY: 1-2x/week  PT DURATION: 6 weeks  PLANNED INTERVENTIONS: Therapeutic exercises, Therapeutic activity, Neuromuscular re-education, Balance training, Gait training, Patient/Family education, Self Care, Joint mobilization, Dry Needling,  Electrical stimulation, Spinal mobilization, Cryotherapy, Moist heat, Ultrasound, Manual therapy, and Re-evaluation  PLAN FOR NEXT SESSION: continue to progress strengthening, review and finalize HEP;re-check goals; modalities PRN   Cristoval Teall, PT 04/07/2022, 8:43 AM

## 2022-04-07 ENCOUNTER — Other Ambulatory Visit (HOSPITAL_BASED_OUTPATIENT_CLINIC_OR_DEPARTMENT_OTHER): Payer: Self-pay

## 2022-04-07 ENCOUNTER — Ambulatory Visit: Payer: BC Managed Care – PPO | Admitting: Physical Therapy

## 2022-04-07 ENCOUNTER — Encounter: Payer: Self-pay | Admitting: Physical Therapy

## 2022-04-07 DIAGNOSIS — M542 Cervicalgia: Secondary | ICD-10-CM | POA: Diagnosis not present

## 2022-04-07 DIAGNOSIS — R293 Abnormal posture: Secondary | ICD-10-CM | POA: Diagnosis not present

## 2022-04-11 ENCOUNTER — Telehealth: Payer: Self-pay | Admitting: *Deleted

## 2022-04-11 ENCOUNTER — Ambulatory Visit: Payer: BC Managed Care – PPO | Admitting: Physical Therapy

## 2022-04-11 ENCOUNTER — Encounter: Payer: Self-pay | Admitting: Physical Therapy

## 2022-04-11 DIAGNOSIS — N62 Hypertrophy of breast: Secondary | ICD-10-CM | POA: Diagnosis not present

## 2022-04-11 DIAGNOSIS — Z538 Procedure and treatment not carried out for other reasons: Secondary | ICD-10-CM | POA: Diagnosis not present

## 2022-04-11 DIAGNOSIS — Z3A32 32 weeks gestation of pregnancy: Secondary | ICD-10-CM | POA: Diagnosis not present

## 2022-04-11 DIAGNOSIS — Z3403 Encounter for supervision of normal first pregnancy, third trimester: Secondary | ICD-10-CM | POA: Diagnosis not present

## 2022-04-11 DIAGNOSIS — R293 Abnormal posture: Secondary | ICD-10-CM | POA: Diagnosis not present

## 2022-04-11 DIAGNOSIS — M542 Cervicalgia: Secondary | ICD-10-CM

## 2022-04-11 DIAGNOSIS — N912 Amenorrhea, unspecified: Secondary | ICD-10-CM | POA: Diagnosis not present

## 2022-04-11 NOTE — Telephone Encounter (Signed)
Auth submitted via [employer] portal for [place] for CPT [id number]  Ref: [id number]

## 2022-04-11 NOTE — Therapy (Signed)
OUTPATIENT PHYSICAL THERAPY TREATMENT PHYSICAL THERAPY DISCHARGE SUMMARY  Visits from Start of Care: 11  Current functional level related to goals / functional outcomes: 50% improvement overall, met LTG # 1, 3, 5, NDI improved from 10% to 4% impairment   Remaining deficits: Continued upper/lower back pain and tightness.    Education / Equipment: HEP  Plan: Patient agrees to discharge.  Patient is being discharged due to meeting the stated rehab goals and referred back to surgeon.        Patient Name: Julie Michael MRN: [id number] DOB:1995-07-20, 27 y.o., female Today's Date: 04/11/2022  END OF SESSION:  PT End of Session - 04/11/22 0811     Visit Number 11    Date for PT Re-Evaluation 04/11/22    Authorization Type [employer]    PT Start Time 0811    PT Stop Time 0845    PT Time Calculation (min) 34 min    Activity Tolerance Patient tolerated treatment well    Behavior During Therapy [facility] for tasks assessed/performed                  Past Medical History:  Diagnosis Date   Herpes    Yeast infection    History reviewed. No pertinent surgical history. Patient Active Problem List   Diagnosis Date Noted   BV (bacterial vaginosis) 12/21/2018   Vagina, candidiasis 12/21/2018   Genital lesion, female 12/21/2018   Exposure to COVID-19 virus 09/11/2018   Strain of abdominal muscle 03/30/2018   Yeast infection 03/30/2018   Healthcare maintenance 07/28/2017   Screen for STD (sexually transmitted disease) 07/28/2017   Discharge from the vagina 09/08/2016   Routine screening for STI (sexually transmitted infection) 09/08/2016   Contraception 09/08/2016   Amenorrhea 04/13/2016   Genital warts 01/23/2015   Sinusitis, chronic 08/09/2013   Vaginal discharge 12/05/2011   VISUAL ACUITY, DECREASED, LEFT EYE 05/26/2009    PCP: Ezequiel Essex, MD  REFERRING PROVIDER: Camillia Herter, MD   REFERRING DIAG: 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   Didn't sleep well.     PERTINENT HISTORY:  No significant medical history  PAIN:  Are you having pain? Yes: NPRS scale: 6/10 Pain location: upper/midback/shoulders Pain description: ache Aggravating factors: poor posture Relieving factors: nothing   PRECAUTIONS: None  WEIGHT BEARING RESTRICTIONS: No  FALLS:  Has patient fallen in last 6 months? 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    CERVICAL ROM:   Active ROM AROM (deg) eval AROM  04/11/2022  Flexion 40 50  Extension 30 55  Right lateral flexion 44 30  Left lateral flexion 40 30  Right rotation 75 80  Left rotation 70 80   (Blank rows = not tested)  UPPER EXTREMITY MMT:  MMT Right eval Left eval  Shoulder flexion 5 5  Shoulder extension    Shoulder abduction 5 5  Shoulder adduction    Shoulder internal rotation 5 5   Shoulder external rotation 5 5  Elbow flexion 5 5  Elbow extension 5 5  Wrist flexion 5 5  Wrist extension 5 5  Grip strong strong   (Blank rows = not tested)  UPPER EXTREMITY ROM:  WNL, symmetric, reports tightness no pain    CERVICAL SPECIAL TESTS:  NT  FUNCTIONAL TESTS:  5 times sit to stand: 9.5 seconds  TODAY'S TREATMENT:                                                                                                                              DATE:  04/11/2022  Therapeutic Exercise: to improve strength and mobility.  Demo, verbal and tactile cues throughout for technique. UBE L2 x 6 min forward  S/L open books x 10 bil  On foam roller - pec stretch - arm flexion/extension thoracic mobs x 20 -angels x 20  Supine PPT x 10 PPT with marches x 10 Bridges x 10  Cat cows x 10 Bridges x 10  Therapeutic Activity:  to assess goals - ROM, NDI Manual Therapy: to decrease muscle spasm and pain and improve mobility.  IASTM with foam roller to bil glutes/QL/lumbar paraspinals.   04/07/22 Therapeutic Exercise: to improve strength and mobility.  Demo, verbal and tactile cues throughout for technique. UBE L3 x 3 min fwd/ 4 min back Lat pull 25# 2x10 Rows 25# 2x10  one set vert and horizontal Wall squat 1x10 with 10# weights in each hand Functional squat x 10# Farmer carry 10# in each hand x 20 feet each OH 10# one  arm with marching in place x 10 each side for anti lateral challenge Lunge position with D2 shoulder ext 5# x 10 ea S/L open book x 10 bil Dying bug x 5 each side Table top 90/90 with OH lat pull 10# x 10, then with alt leg press (dying bug) x 5 each side Bridges with hip ADD ball squeeze 2x5 with alt knee ext  Constance Haw with GTB x 10,  10x5" Bird dog 2x5 - with knee to elbow crunch   04/04/22 Therapeutic Exercise: to improve strength and mobility.  Demo, verbal and tactile cues throughout for technique. UBE L3 x 3 min fwd/ 3 min back S/L open book x 10 bil Cat/cow  x 10 bil Bridges with hip ADD ball squeeze 10x5" Bridges with GTB 10x5" Bird dog GTB 2x10 - 5 sec hold Wall squat 2x10   PATIENT EDUCATION:  Education details: HEP update Person educated: Patient Education method: Consulting civil engineer, Demonstration, Verbal cues, and Handouts Education comprehension: verbalized understanding and returned demonstration  HOME EXERCISE PROGRAM: Access Code: [email or web address]   ASSESSMENT:  CLINICAL IMPRESSION: Delsy has done well in physical therapy and reports 50% improvement overall in pain and decreased tightness in neck.  Her NDI has improved to only 4% impairment.  She has met goals #1, 3 and 5, but continues to report pain in upper and mid back despite strengthening and stretching, so discharging today, will return back to surgeon on Thursday to discuss surgery further.      OBJECTIVE IMPAIRMENTS: decreased mobility, decreased ROM, increased fascial restrictions, increased muscle spasms, postural dysfunction, and pain.   ACTIVITY LIMITATIONS: carrying, lifting, bending, standing, and sleeping  PARTICIPATION LIMITATIONS: community activity and occupation  PERSONAL FACTORS: Time since onset of injury/illness/exacerbation are also affecting patient's functional outcome.   REHAB POTENTIAL: Good  CLINICAL DECISION MAKING: Stable/uncomplicated  EVALUATION COMPLEXITY: Low   GOALS: Goals reviewed with patient? Yes  SHORT TERM GOALS: Target date: 03/14/2022   Patient will be independent with initial HEP.  Baseline: given Goal status: MET 03/16/22  LONG TERM GOALS: Target date: 04/11/2022   Patient will be independent with advanced/ongoing HEP to improve outcomes and carryover.  Baseline: needs progression  Goal status: MET  04/11/2022 - good compliance.   2.  Patient will report 75% improvement in neck pain to improve QOL.  Baseline:  Goal status: IN PROGRESS - 03/18/22 patient only reports 30% improvement in pain, however she reports she did not have much  neck pain to start with  04/11/2022 - 50% improvement.   3.  Patient will demonstrate full pain free cervical ROM for safety with driving.  Baseline: see objective, tightness flexion/extension Goal status: MET 04/11/22- improved rotation bil.   4.  Patient will report 0% impairment on NDI  to demonstrate improved functional ability.  Baseline: 10% impairment Goal status: IN PROGRESS - 04/11/22- 4% impairment (2/50)  5.  Patient will demonstrate improved posture to decrease muscle imbalance. Baseline: forward head posture Goal status: MET 04/11/2022  PLAN:  PT FREQUENCY: 1-2x/week  PT DURATION: 6 weeks  PLANNED INTERVENTIONS: Therapeutic exercises, Therapeutic activity, Neuromuscular re-education, Balance training, Gait training, Patient/Family education, Self Care, Joint mobilization, Dry Needling, Electrical stimulation, Spinal mobilization, Cryotherapy, Moist heat, Ultrasound, Manual therapy, and Re-evaluation  PLAN FOR NEXT SESSION: discharge    Rennie Natter, PT, DPT  04/11/2022, 9:48 AM

## 2022-04-14 ENCOUNTER — Ambulatory Visit (INDEPENDENT_AMBULATORY_CARE_PROVIDER_SITE_OTHER): Payer: BC Managed Care – PPO | Admitting: Plastic Surgery

## 2022-04-14 ENCOUNTER — Encounter: Payer: Self-pay | Admitting: Plastic Surgery

## 2022-04-14 VITALS — BP 132/84 | HR 72 | Ht 60.0 in | Wt 175.0 lb

## 2022-04-14 DIAGNOSIS — N62 Hypertrophy of breast: Secondary | ICD-10-CM | POA: Diagnosis not present

## 2022-04-14 NOTE — Progress Notes (Signed)
Julie Michael today to discuss her bilateral breast reduction.  She has completed her physical therapy and is ready to proceed with the reduction.  We discussed the procedure again.  Her specific questions revolved around postoperative pain control which I think she will do quite well from as well as whether the position of her nipples will be changed which they will.  She stated that all questions were answered to her satisfaction and she would like to proceed.  Her routing slip has been submitted and we are just waiting for surgical date.

## 2022-04-18 ENCOUNTER — Telehealth: Payer: Self-pay | Admitting: Plastic Surgery

## 2022-04-18 NOTE — Telephone Encounter (Signed)
Pt called to say insurance states they approved surgery on 04/13/22 and loaded it into the portal.  She questioned what the total cost would be for her surgery and explained contracted amount with insurance and she would have to speak with [employer] of [place] to discuss what her portion of the cost would be.  Would like a call back from the surgery scheduler.

## 2022-04-20 ENCOUNTER — Telehealth: Payer: Self-pay | Admitting: *Deleted

## 2022-04-20 DIAGNOSIS — Z719 Counseling, unspecified: Secondary | ICD-10-CM

## 2022-04-20 NOTE — Telephone Encounter (Signed)
Spoke with patient to schedule sx and related appts  

## 2022-04-25 NOTE — Progress Notes (Unsigned)
Patient ID: Julie Michael, female    DOB: 09-24-1995, 27 y.o.   MRN: [id number]  No chief complaint on file.   No diagnosis found.   History of Present Illness: Julie Michael is a 27 y.o.  female  with a history of macromastia.  She presents for preoperative evaluation for upcoming procedure, bilateral breast reduction, scheduled for 05/17/2022 with Dr.  Lovena Le .  The patient {HAS HAS [id number] had problems with anesthesia. ***  Summary of Previous Visit: She was seen for consult on 02/24/2022 Dr. Lovena Le.  At that time, she complained of chronic upper back and neck discomfort in the context of large breasts.  Discussed breast reduction surgery, 500 g excess tissue removed from each side.  She then was referred to PT and did not see any considerable improvement in her symptoms.  Discussed risks and benefits of surgery.  Job: Secretary/administrator, discussed 4 weeks of FMLA.  Completed forms with [employer].  PMH Significant for: Macromastia.   Past Medical History: Allergies: No Known Allergies  Current Medications:  Current Outpatient Medications:    mupirocin cream (BACTROBAN) 2 %, Apply 1 application topically 2 (two) times daily., Disp: 15 g, Rfl: 0   ondansetron (ZOFRAN) 4 MG tablet, Take 1 tablet (4 mg total) by mouth every 8 (eight) hours as needed for nausea or vomiting., Disp: 20 tablet, Rfl: 0   terconazole (TERAZOL 7) 0.4 % vaginal cream, Place 1 applicator vaginally at bedtime., Disp: 45 g, Rfl: 0   valACYclovir (VALTREX) 500 MG tablet, Take 1 tablet (500 mg total) by mouth daily., Disp: 30 tablet, Rfl: 3  Past Medical Problems: Past Medical History:  Diagnosis Date   Herpes    Yeast infection     Past Surgical History: No past surgical history on file.  Social History: Social History   Socioeconomic History   Marital status: Single    Spouse name: Not on file   Number of children: Not on file   Years of education: Not on file   Highest education level: Not  on file  Occupational History   Not on file  Tobacco Use   Smoking status: Never   Smokeless tobacco: Never  Vaping Use   Vaping Use: Never used  Substance and Sexual Activity   Alcohol use: No   Drug use: No   Sexual activity: Yes    Birth control/protection: None  Other Topics Concern   Not on file  Social History Narrative   Not on file   Social Determinants of Health   Financial Resource Strain: Not on file  Food Insecurity: Not on file  Transportation Needs: Not on file  Physical Activity: Not on file  Stress: Not on file  Social Connections: Not on file  Intimate Partner Violence: Not on file    Family History: Family History  Problem Relation Age of Onset   Diabetes Other    Hypertension Other     Review of Systems: ROS  Physical Exam: Vital Signs There were no vitals taken for this visit.  Physical Exam *** Constitutional:      General: Not in acute distress.    Appearance: Normal appearance. Not ill-appearing.  HENT:     Head: Normocephalic and atraumatic.  Eyes:     Pupils: Pupils are equal, round. Cardiovascular:     Rate and Rhythm: Normal rate.    Pulses: Normal pulses.  Pulmonary:     Effort: No respiratory distress or increased work of breathing.  Speaks  in full sentences. Abdominal:     General: Abdomen is flat. No distension.   Musculoskeletal: Normal range of motion. No lower extremity swelling or edema. No varicosities. *** Skin:    General: Skin is warm and dry.     Findings: No erythema or rash.  Neurological:     Mental Status: Alert and oriented to person, place, and time.  Psychiatric:        Mood and Affect: Mood normal.        Behavior: Behavior normal.    Assessment/Plan: The patient is scheduled for bilateral breast reduction with Dr.  Lovena Le .  Risks, benefits, and alternatives of procedure discussed, questions answered and consent obtained.    Smoking Status: ***; Counseling Given? *** Last Mammogram: N/A due to  age.  Caprini Score: ***; Risk Factors include: ***, BMI *** 25, and length of planned surgery. Recommendation for mechanical *** pharmacological prophylaxis. Encourage early ambulation.   Pictures obtained: 02/24/2022  Post-op Rx sent to pharmacy: ***  Patient was provided with the General Surgical Risk consent document and Pain Medication Agreement prior to their appointment.  They had adequate time to read through the risk consent documents and Pain Medication Agreement. We also discussed them in person together during this preop appointment. All of their questions were answered to their satisfaction.  Recommended calling if they have any further questions.  Risk consent form and Pain Medication Agreement to be scanned into patient's chart.  The risk that can be encountered with breast reduction were discussed and include the following but not limited to these:  Breast asymmetry, fluid accumulation, firmness of the breast, inability to breast feed, loss of nipple or areola, skin loss, decrease or no nipple sensation, fat necrosis of the breast tissue, bleeding, infection, healing delay.  There are risks of anesthesia, changes to skin sensation and injury to nerves or blood vessels.  The muscle can be temporarily or permanently injured.  You may have an allergic reaction to tape, suture, glue, blood products which can result in skin discoloration, swelling, pain, skin lesions, poor healing.  Any of these can lead to the need for revisonal surgery or stage procedures.  A reduction has potential to interfere with diagnostic procedures.  Nipple or breast piercing can increase risks of infection.  This procedure is best done when the breast is fully developed.  Changes in the breast will continue to occur over time.  Pregnancy can alter the outcomes of previous breast reduction surgery, weight gain and weigh loss can also effect the long term appearance.   We discussed the possibility of amputation/free  nipple graft technique due to the length of her STN.  She is understanding of the possibility that we would need to transition from a pedicle technique to a free nipple graft technique intraoperatively.  We discussed the risks associated with free nipple graft breast reductions, including but not limited to failure of the graft, partial loss of the graft, loss of sensation of bilateral nipple areola, complete loss of the nipple areola graft, inability to breast-feed, postoperative wounds, ongoing wound care.  We also discussed the risks associated with the pedicle technique.  We discussed that with the pedicle technique she could develop nipple areolar necrosis which would result in loss of the nipple, this would also result in ongoing wound care and possible changes in the shape of her breast.     Electronically signed by: Krista Blue, PA-C 04/25/2022 3:18 PM

## 2022-04-27 ENCOUNTER — Ambulatory Visit (INDEPENDENT_AMBULATORY_CARE_PROVIDER_SITE_OTHER): Payer: BC Managed Care – PPO | Admitting: Physician Assistant

## 2022-04-27 VITALS — BP 146/81 | HR 81 | Ht 60.0 in | Wt 174.6 lb

## 2022-04-27 DIAGNOSIS — N62 Hypertrophy of breast: Secondary | ICD-10-CM

## 2022-04-27 MED ORDER — ONDANSETRON 4 MG PO TBDP: 4.0000 mg | ORAL_TABLET | Freq: Three times a day (TID) | ORAL | 0 refills | Status: DC | PRN

## 2022-04-27 MED ORDER — OXYCODONE HCL 5 MG PO TABS: 5.0000 mg | ORAL_TABLET | Freq: Four times a day (QID) | ORAL | 0 refills | Status: AC | PRN

## 2022-05-10 ENCOUNTER — Encounter (HOSPITAL_BASED_OUTPATIENT_CLINIC_OR_DEPARTMENT_OTHER): Payer: Self-pay | Admitting: Plastic Surgery

## 2022-05-10 ENCOUNTER — Other Ambulatory Visit: Payer: Self-pay

## 2022-05-13 ENCOUNTER — Telehealth: Payer: Self-pay | Admitting: Plastic Surgery

## 2022-05-13 NOTE — Telephone Encounter (Signed)
Final payment pd today for sx:3/5.  Full amount completely pd $2250.00 as of today 05/13/22.

## 2022-05-16 ENCOUNTER — Encounter (HOSPITAL_COMMUNITY): Payer: Self-pay | Admitting: Anesthesiology

## 2022-05-16 NOTE — Anesthesia Preprocedure Evaluation (Signed)
Anesthesia Evaluation    Reviewed: Allergy & Precautions, Patient's Chart, lab work & pertinent test results  Airway        Dental   Pulmonary neg pulmonary ROS          Cardiovascular negative cardio ROS      Neuro/Psych negative neurological ROS  negative psych ROS   GI/Hepatic negative GI ROS, Neg liver ROS,,,  Endo/Other  Obesity BMI 34  Renal/GU negative Renal ROS  negative genitourinary   Musculoskeletal negative musculoskeletal ROS (+)    Abdominal   Peds  Hematology negative hematology ROS (+)   Anesthesia Other Findings   Reproductive/Obstetrics negative OB ROS                             Anesthesia Physical Anesthesia Plan  ASA: 2  Anesthesia Plan: General   Post-op Pain Management: Tylenol PO (pre-op)*, Toradol IV (intra-op)* and Precedex   Induction: Intravenous  PONV Risk Score and Plan: 3 and Ondansetron, Dexamethasone, Midazolam, Treatment may vary due to age or medical condition and Scopolamine patch - Pre-op  Airway Management Planned: Oral ETT  Additional Equipment: None  Intra-op Plan:   Post-operative Plan: Extubation in OR  Informed Consent:   Plan Discussed with:   Anesthesia Plan Comments:        Anesthesia Quick Evaluation

## 2022-05-17 ENCOUNTER — Ambulatory Visit (INDEPENDENT_AMBULATORY_CARE_PROVIDER_SITE_OTHER): Payer: BC Managed Care – PPO | Admitting: Student

## 2022-05-17 ENCOUNTER — Ambulatory Visit (HOSPITAL_BASED_OUTPATIENT_CLINIC_OR_DEPARTMENT_OTHER)
Admission: RE | Admit: 2022-05-17 | Discharge: 2022-05-17 | Disposition: A | Discharge status: Home / Self Care | Payer: BC Managed Care – PPO | Source: Ambulatory Visit | Attending: Plastic Surgery | Admitting: Plastic Surgery

## 2022-05-17 ENCOUNTER — Encounter: Payer: Self-pay | Admitting: Student

## 2022-05-17 ENCOUNTER — Encounter (HOSPITAL_BASED_OUTPATIENT_CLINIC_OR_DEPARTMENT_OTHER)
Admission: RE | Disposition: A | Discharge status: Home / Self Care | Payer: Self-pay | Source: Ambulatory Visit | Attending: Plastic Surgery

## 2022-05-17 VITALS — BP 108/78 | HR 91 | Wt 170.0 lb

## 2022-05-17 DIAGNOSIS — Z538 Procedure and treatment not carried out for other reasons: Secondary | ICD-10-CM | POA: Insufficient documentation

## 2022-05-17 DIAGNOSIS — N912 Amenorrhea, unspecified: Secondary | ICD-10-CM

## 2022-05-17 DIAGNOSIS — N62 Hypertrophy of breast: Secondary | ICD-10-CM | POA: Diagnosis not present

## 2022-05-17 DIAGNOSIS — Z01818 Encounter for other preprocedural examination: Secondary | ICD-10-CM

## 2022-05-17 LAB — POCT URINE PREGNANCY: Preg Test, Ur: POSITIVE — AB

## 2022-05-17 LAB — POCT PREGNANCY, URINE: Preg Test, Ur: POSITIVE — AB

## 2022-05-17 SURGERY — MAMMOPLASTY, REDUCTION
Anesthesia: General | Site: Breast | Laterality: Bilateral

## 2022-05-17 MED ORDER — CHLORHEXIDINE GLUCONATE CLOTH 2 % EX PADS: 6.0000 | MEDICATED_PAD | Freq: Once | CUTANEOUS | Status: DC

## 2022-05-17 MED ORDER — LACTATED RINGERS IV SOLN: INTRAVENOUS | Status: DC

## 2022-05-17 MED ORDER — CEFAZOLIN SODIUM-DEXTROSE 2-4 GM/100ML-% IV SOLN: 2.0000 g | INTRAVENOUS | Status: DC

## 2022-05-17 MED ORDER — ACETAMINOPHEN 500 MG PO TABS: 1000.0000 mg | ORAL_TABLET | Freq: Once | ORAL | Status: DC

## 2022-05-17 MED ORDER — SCOPOLAMINE 1 MG/3DAYS TD PT72: 1.0000 | MEDICATED_PATCH | TRANSDERMAL | Status: DC

## 2022-05-17 NOTE — Progress Notes (Signed)
    SUBJECTIVE:   CHIEF COMPLAINT / HPI:   Patient is a 27 year old female presenting today to confirm pregnancy.  Patient reports she was scheduled for a breast reduction surgery today and at her appointment where surgery was supposed to be performed her pregnancy test was positive and hence procedure was halted.  Patient wanted to come in today to confirm pregnancy test. Pregnancy test today was positive and patient was accompanied by boyfriend.  Patient expressed that she is very surprised by the news as she was not expecting the news. She would need sometime to process this. Boyfriend was supportive.  Denies any morning sickness, no nausea or vomiting. She had spotting last week but reports no period. Her periods are usually irregular, they some times will come on late or early.  PERTINENT  PMH / PSH: Reviewed   OBJECTIVE:   BP 108/78   Pulse 91   Wt 170 lb (77.1 kg)   LMP 04/11/2022 (Exact Date)   SpO2 98%   BMI 33.20 kg/m     Physical Exam General: Alert, well appearing, NAD, Oriented x4 Cardiovascular: RRR, No Murmurs, Normal S2/S2 Respiratory: CTAB, No wheezing or Rales Abdomen: No distension or tenderness Extremities: No edema on extremities   Psych: Normal affect,   ASSESSMENT/PLAN:   Positive pregnancy Patient's pregnancy test today was positive. Denies any nausea or vomiting. She is surprised by the news as this was unplanned. With boyfriend by the side and given the sensitive nature we agreed via shared decision making that patient will take time to reflect and follow up after she has process the news . Patient will defer OB labs for now. -Obtained urine pregnancy test -Advised patient to call and schedule an OB visit soon -Discussed expectation of prenatal visits -Provided couples with [facility] contact                     Alen Bleacher, MD [place]

## 2022-05-17 NOTE — Patient Instructions (Addendum)
It was wonderful to meet you today. Thank you for allowing me to be a part of your care. Below is a short summary of what we discussed at your visit today:  Your pregnancy test today was positive.  Call to schedule an appointment for follow-up for your initial OB visit with in the next 3 weeks.   Below is the contact for [facility] [phone]  Please bring all of your medications to every appointment!  If you have any questions or concerns, please do not hesitate to contact us via phone or MyChart message.   Alen Bleacher, MD [facility]    .[place]

## 2022-05-17 NOTE — Progress Notes (Signed)
Dr Lovena Le made aware of positive urine pregnancy results.

## 2022-05-17 NOTE — H&P (View-Only) (Signed)
Dr Lovena Le made aware of positive urine pregnancy results.

## 2022-05-17 NOTE — Interval H&P Note (Signed)
History and Physical Interval Note: Pt with positive pregnancy test.   Surgery cancelled and patient asked to call the office and make an appointment to re-schedule surgery when appropriate.   05/17/2022 6:54 AM  Julie Michael  has presented today for surgery, with the diagnosis of macromastia.  The various methods of treatment have been discussed with the patient and family. After consideration of risks, benefits and other options for treatment, the patient has consented to  Procedure(s): MAMMARY REDUCTION  (BREAST) (Bilateral) as a surgical intervention.  The patient's history has been reviewed, patient examined, no change in status, stable for surgery.  I have reviewed the patient's chart and labs.  Questions were answered to the patient's satisfaction.     Camillia Herter

## 2022-05-18 ENCOUNTER — Encounter: Payer: BC Managed Care – PPO | Admitting: Physician Assistant

## 2022-05-23 ENCOUNTER — Other Ambulatory Visit: Payer: Self-pay

## 2022-05-23 ENCOUNTER — Ambulatory Visit (INDEPENDENT_AMBULATORY_CARE_PROVIDER_SITE_OTHER): Payer: BC Managed Care – PPO | Admitting: Family Medicine

## 2022-05-23 ENCOUNTER — Other Ambulatory Visit (HOSPITAL_COMMUNITY): Payer: Self-pay | Admitting: Family Medicine

## 2022-05-23 ENCOUNTER — Encounter: Payer: Self-pay | Admitting: Family Medicine

## 2022-05-23 VITALS — BP 124/82 | HR 95 | Wt 177.8 lb

## 2022-05-23 DIAGNOSIS — B9689 Other specified bacterial agents as the cause of diseases classified elsewhere: Secondary | ICD-10-CM

## 2022-05-23 DIAGNOSIS — Z3401 Encounter for supervision of normal first pregnancy, first trimester: Secondary | ICD-10-CM

## 2022-05-23 DIAGNOSIS — Z3403 Encounter for supervision of normal first pregnancy, third trimester: Secondary | ICD-10-CM | POA: Insufficient documentation

## 2022-05-23 DIAGNOSIS — Z3400 Encounter for supervision of normal first pregnancy, unspecified trimester: Secondary | ICD-10-CM | POA: Insufficient documentation

## 2022-05-23 LAB — POCT WET PREP (WET MOUNT)
Clue Cells Wet Prep Whiff POC: POSITIVE
Trichomonas Wet Prep HPF POC: ABSENT

## 2022-05-23 MED ORDER — METRONIDAZOLE 500 MG PO TABS: 500.0000 mg | ORAL_TABLET | Freq: Two times a day (BID) | ORAL | 0 refills | Status: DC

## 2022-05-23 NOTE — Progress Notes (Unsigned)
Patient Name: Julie Michael Date of Birth: Jul 20, 1995 [place] Initial Prenatal Visit  Julie Michael is a 27 y.o. year old G1P0 at Unknown who presents for her initial prenatal visit. Pregnancy is not planned She reports breast tenderness, fatigue, and frequent urination. She is taking a prenatal vitamin.  She denies pelvic pain or vaginal bleeding.   Pregnancy Dating: The patient is dated by LMP, uncertain .  LMP: January [id number]  Period is certain:  Yes.  Periods were regular:  No.  LMP was a typical period:  Yes.  Using hormonal contraception in 3 months prior to conception: No  Lab Review: Does not yet have OB labs   PMH: Reviewed and as detailed below: HTN: No  Gestational Hypertension/preeclampsia: No  Type 1 or 2 Diabetes: No  Depression:  No  Seizure disorder:  No VTE: No ,  History of STI Yes, trichomonas, chlamydia Abnormal Pap smear:  No, Genital herpes simplex:  Yes   PSH: Gynecologic Surgery:  no Surgical history reviewed, notable for: none  Obstetric History: Obstetric history tab updated and reviewed.  Summary of prior pregnancies: No prior pregnancies  Indications for referral were reviewed, and the patient has no obstetric indications for referral to [facility] at this time.   Social History: Partner's name: Alonna Minium  Tobacco use: No Alcohol use:  Yes, previously 1-2 drinks a week, is abstaining now  Other substance use:  No  Current Medications:  None, tylenol as needed   Reviewed and appropriate in pregnancy.   Genetic and Infection Screen: Flow Sheet Updated Yes  Prenatal Exam: Gen: Well nourished, well developed.  No distress.  Vitals noted. HEENT: Normocephalic, atraumatic.  Neck supple without cervical lymphadenopathy, thyromegaly or thyroid nodules.  Fair dentition. CV: RRR no murmur, gallops or rubs Lungs: CTA B.  Normal respiratory effort without wheezes or rales. Abd: soft, NTND. +BS.  Uterus not  appreciated above pelvis. GU: Normal external female genitalia without lesions.  Nl vaginal, well rugated without lesions. No vaginal discharge.  Bimanual exam: No adnexal mass or TTP. No CMT.  Uterus size wnl  Ext: No clubbing, cyanosis or edema. Psych: Normal grooming and dress.  Not depressed or anxious appearing.  Normal thought content and process without flight of ideas or looseness of associations  Fetal heart tones: Very early pregnancy appropriate to not have heart tones as of yet  Vitals:   05/23/22 1508  BP: 124/82  Pulse: 95    Assessment/Plan:  Julie Michael is a 27 y.o. G1P0 at Unknown who presents to initiate prenatal care. She is doing well.  Current pregnancy issues include None.  Routine prenatal care: As dating is not reliable, a dating ultrasound has been ordered. Dating tab updated. Pre-pregnancy weight updated. Expected weight gain this pregnancy is 11-20 pounds  Prenatal labs have not been completed yet, will order  Indications for referral to [facility] were reviewed and the patient does not meet criteria for referral.  Medication list reviewed and updated.  Recommended patient see a dentist for regular care.  Bleeding and pain precautions reviewed. Importance of prenatal vitamins reviewed.  Will discuss genetic screening at next visit  The patient has the following indications for aspirinto begin 81 mg at 12-16 weeks: One high risk condition: no single high risk condition  MORE than one moderate risk condition: nulliparity, obesity, and identifies as African American  Aspirin was  recommended today based upon above risk factors (one high risk condition or more than one  moderate risk factor)  The patient will not be age 68 or over at time of delivery. Referral to genetic counseling was not offered today.  The patient has the following risk factors for preexisting diabetes: BMI > 25 and high risk ethnicity (Latino, [place] American, Native American, Frannie, Asian Optometrist) . An early 1 hour glucose tolerance test was not ordered. Will be ordered at next visit  Pregnancy Medical Home and PHQ-9 forms completed, problems noted: No  2. Pregnancy issues include the following which were addressed today:  None   Follow up 4 weeks for next prenatal visit.

## 2022-05-23 NOTE — Patient Instructions (Signed)
It was wonderful to see you today.  Please bring ALL of your medications with you to every visit.   Today we talked about:  Pregnancy - congratulations!! We are getting a bunch of labs I will follow up on. For now just keep taking your prenatal vitamin. If you need anything reach out.   Please follow up in 1 month.   Thank you for choosing [facility].   Please call [phone] with any questions about today's appointment.  Lowry Ram, MD  Family Medicine

## 2022-05-24 ENCOUNTER — Other Ambulatory Visit: Payer: BC Managed Care – PPO

## 2022-05-24 ENCOUNTER — Other Ambulatory Visit (HOSPITAL_COMMUNITY)
Admission: RE | Admit: 2022-05-24 | Discharge: 2022-05-24 | Disposition: A | Discharge status: Home / Self Care | Payer: BC Managed Care – PPO | Source: Ambulatory Visit | Attending: Family Medicine | Admitting: Family Medicine

## 2022-05-24 DIAGNOSIS — Z3A00 Weeks of gestation of pregnancy not specified: Secondary | ICD-10-CM | POA: Diagnosis not present

## 2022-05-24 DIAGNOSIS — Z3401 Encounter for supervision of normal first pregnancy, first trimester: Secondary | ICD-10-CM | POA: Diagnosis not present

## 2022-05-25 ENCOUNTER — Telehealth: Payer: Self-pay | Admitting: Plastic Surgery

## 2022-05-26 ENCOUNTER — Encounter: Payer: BC Managed Care – PPO | Admitting: Plastic Surgery

## 2022-05-26 LAB — CYTOLOGY - PAP
Chlamydia: NEGATIVE
Comment: NEGATIVE
Comment: NORMAL
Diagnosis: NEGATIVE
Neisseria Gonorrhea: NEGATIVE

## 2022-05-30 LAB — CBC/D/PLT+RPR+RH+ABO+RUBIGG...
Antibody Screen: NEGATIVE
Basophils Absolute: 0 10*3/uL (ref 0.0–0.2)
Basos: 1 %
Bilirubin, UA: NEGATIVE
EOS (ABSOLUTE): 0.1 10*3/uL (ref 0.0–0.4)
Eos: 2 %
Glucose, UA: NEGATIVE
HCV Ab: NONREACTIVE
HIV Screen 4th Generation wRfx: NONREACTIVE
Hematocrit: 39 % (ref 34.0–46.6)
Hemoglobin: 12.3 g/dL (ref 11.1–15.9)
Hepatitis B Surface Ag: NEGATIVE
Immature Grans (Abs): 0 10*3/uL (ref 0.0–0.1)
Immature Granulocytes: 0 %
Ketones, UA: NEGATIVE
Lymphocytes Absolute: 2.4 10*3/uL (ref 0.7–3.1)
Lymphs: 28 %
MCH: 28.3 pg (ref 26.6–33.0)
MCHC: 31.5 g/dL (ref 31.5–35.7)
MCV: 90 fL (ref 79–97)
Monocytes Absolute: 0.4 10*3/uL (ref 0.1–0.9)
Monocytes: 5 %
Neutrophils Absolute: 5.5 10*3/uL (ref 1.4–7.0)
Neutrophils: 64 %
Nitrite, UA: NEGATIVE
Platelets: 350 10*3/uL (ref 150–450)
Protein,UA: NEGATIVE
RBC, UA: NEGATIVE
RBC: 4.35 x10E6/uL (ref 3.77–5.28)
RDW: 11.6 % — ABNORMAL LOW (ref 11.7–15.4)
RPR Ser Ql: NONREACTIVE
Rh Factor: POSITIVE
Rubella Antibodies, IGG: 3.01 index (ref >0.99)
Specific Gravity, UA: 1.022 (ref 1.005–1.030)
Urobilinogen, Ur: 0.2 mg/dL (ref 0.2–1.0)
WBC: 8.5 10*3/uL (ref 3.4–10.8)
pH, UA: 6 (ref 5.0–7.5)

## 2022-05-30 LAB — VARICELLA ZOSTER ABS, IGG/IGM
Varicella IgM: <0.91 index (ref 0.00–0.90)
Varicella zoster IgG: 2254 index (ref >165)

## 2022-05-30 LAB — URINE CULTURE, OB REFLEX

## 2022-05-30 LAB — HGB FRACTIONATION CASCADE
Hgb A2: 2.8 % (ref 1.8–3.2)
Hgb A: 97.2 % (ref 96.4–98.8)
Hgb F: 0 % (ref 0.0–2.0)
Hgb S: 0 %

## 2022-05-30 LAB — MICROSCOPIC EXAMINATION
Casts: NONE SEEN /lpf
Epithelial Cells (non renal): >10 /hpf — AB (ref 0–10)
RBC, Urine: NONE SEEN /hpf (ref 0–2)

## 2022-05-30 LAB — HCV INTERPRETATION

## 2022-06-01 ENCOUNTER — Ambulatory Visit
Admission: RE | Admit: 2022-06-01 | Discharge: 2022-06-01 | Disposition: A | Discharge status: Home / Self Care | Payer: BC Managed Care – PPO | Source: Ambulatory Visit | Attending: Family Medicine | Admitting: Family Medicine

## 2022-06-01 DIAGNOSIS — Z3401 Encounter for supervision of normal first pregnancy, first trimester: Secondary | ICD-10-CM | POA: Diagnosis not present

## 2022-06-01 DIAGNOSIS — N83201 Unspecified ovarian cyst, right side: Secondary | ICD-10-CM | POA: Insufficient documentation

## 2022-06-01 DIAGNOSIS — Z3A01 Less than 8 weeks gestation of pregnancy: Secondary | ICD-10-CM | POA: Insufficient documentation

## 2022-06-01 DIAGNOSIS — O208 Other hemorrhage in early pregnancy: Secondary | ICD-10-CM | POA: Diagnosis not present

## 2022-06-01 DIAGNOSIS — O3481 Maternal care for other abnormalities of pelvic organs, first trimester: Secondary | ICD-10-CM | POA: Diagnosis not present

## 2022-06-03 ENCOUNTER — Telehealth: Payer: Self-pay

## 2022-06-03 DIAGNOSIS — B9689 Other specified bacterial agents as the cause of diseases classified elsewhere: Secondary | ICD-10-CM

## 2022-06-03 DIAGNOSIS — Z3401 Encounter for supervision of normal first pregnancy, first trimester: Secondary | ICD-10-CM

## 2022-06-03 NOTE — Telephone Encounter (Signed)
Patient calls nurse line regarding questions and concerns regarding [place] from 3/20. Patient was able to view these results via mychart and is concerned about the cyst found on her ovary.   She would like to speak with Dr. Gwendolyn Lima regarding this.   Please return call to patient at [phone].  Talbot Grumbling, RN

## 2022-06-06 ENCOUNTER — Telehealth: Payer: Self-pay

## 2022-06-06 MED ORDER — FLUCONAZOLE 150 MG PO TABS: 150.0000 mg | ORAL_TABLET | Freq: Once | ORAL | 0 refills | Status: DC

## 2022-06-06 NOTE — Telephone Encounter (Signed)
error 

## 2022-06-06 NOTE — Telephone Encounter (Signed)
Called patient to discuss results of ultrasound. Explained to patient that hemorrhagic cyst could be the corpeus luteum remnant and this often appears similar to hemorrhagic cyst on ultrasound. I did give patient precaution; however, that this is a risk for ovarian torsion. I advised her to immediately go to the ED or MAU if she has lower abdominal pain.   Also discussed subchorionic hemorrhage with her and that at this stage of the pregnancy this is a common finding. Will order a follow up ultrasound to ensure that cyst and hemorrhage do not grow.   Patient mentioned that she has a bad yeast infection after taking BV treatment. I will send diflucan to patient's pharmacy.

## 2022-06-06 NOTE — Telephone Encounter (Signed)
[facility] calls nurse line in regards to Diflucan prescription.   Pharmacist reports Diflucan is not indicated in first trimester pregnancy. She reports she would like to double check with provider before she dispenses to the patient.   Will forward to PCP.

## 2022-06-06 NOTE — Telephone Encounter (Signed)
Patient calls nurse line returning a phone call.   Will forward to Dr. Gwendolyn Lima call back. Patient advised to keep her phone on her. She apologizes for missing earlier as she reports she was in the bathroom.

## 2022-06-06 NOTE — Telephone Encounter (Signed)
Please enter correct order without "MFM" in it. Phynix Horton Kennon Holter, CMA

## 2022-06-07 NOTE — Addendum Note (Signed)
Addended by: Jacelyn Grip B on: 06/07/2022 10:22 AM   Modules accepted: Orders

## 2022-06-07 NOTE — Telephone Encounter (Signed)
Thank you so much! Pt scheduled and informed. Jennylee Uehara Kennon Holter, CMA

## 2022-06-07 NOTE — Telephone Encounter (Signed)
I am covering for Dr. Gwendolyn Lima this week. I have placed the US OB Complete <14 weeks without MFM. Let me know if you need anything else from me. Thanks!

## 2022-06-08 ENCOUNTER — Encounter: Payer: BC Managed Care – PPO | Admitting: Physician Assistant

## 2022-06-08 MED ORDER — CLOTRIMAZOLE 1 % EX CREA: 1.0000 | TOPICAL_CREAM | Freq: Every day | CUTANEOUS | 0 refills | Status: AC

## 2022-06-08 NOTE — Addendum Note (Signed)
Addended by: Jacelyn Grip B on: 06/08/2022 09:10 AM   Modules accepted: Orders

## 2022-06-08 NOTE — Telephone Encounter (Signed)
I have sent in clotrimazole topical cream to be applied intravaginally for 7 days given patient is pregnant. I have discontinued the diflucan in her chart.

## 2022-06-15 ENCOUNTER — Telehealth: Payer: Self-pay

## 2022-06-15 DIAGNOSIS — O210 Mild hyperemesis gravidarum: Secondary | ICD-10-CM

## 2022-06-15 NOTE — Telephone Encounter (Signed)
Patient calls nurse line requesting medication for nausea and letter for work.   Reports that she has been having increased levels of nausea. She reports that this is occurring all day. Reports that she is vomiting after eating solid foods, however, is tolerating fluids.   Denies medication allergies.   She is also needing letter for work to allow her to come in later due to morning sickness. She would also like letter that states that if she begins experiencing back pain, she should be allowed to sit while working. She works at [employer].   Will forward to PCP.   Talbot Grumbling, RN

## 2022-06-16 ENCOUNTER — Ambulatory Visit (INDEPENDENT_AMBULATORY_CARE_PROVIDER_SITE_OTHER): Payer: BC Managed Care – PPO | Admitting: Family Medicine

## 2022-06-16 DIAGNOSIS — O210 Mild hyperemesis gravidarum: Secondary | ICD-10-CM

## 2022-06-16 DIAGNOSIS — O24410 Gestational diabetes mellitus in pregnancy, diet controlled: Secondary | ICD-10-CM | POA: Diagnosis not present

## 2022-06-16 DIAGNOSIS — B009 Herpesviral infection, unspecified: Secondary | ICD-10-CM | POA: Diagnosis not present

## 2022-06-16 DIAGNOSIS — Z3401 Encounter for supervision of normal first pregnancy, first trimester: Secondary | ICD-10-CM

## 2022-06-16 DIAGNOSIS — Z3A38 38 weeks gestation of pregnancy: Secondary | ICD-10-CM | POA: Diagnosis not present

## 2022-06-16 HISTORY — DX: Mild hyperemesis gravidarum: O21.0

## 2022-06-16 MED ORDER — DOXYLAMINE-PYRIDOXINE 10-10 MG PO TBEC
DELAYED_RELEASE_TABLET | ORAL | 0 refills | Status: DC
Start: 2022-06-16 — End: 2022-08-16

## 2022-06-16 MED ORDER — DOXYLAMINE-PYRIDOXINE 10-10 MG PO TBEC: DELAYED_RELEASE_TABLET | ORAL | 0 refills | Status: DC

## 2022-06-16 NOTE — Progress Notes (Signed)
    SUBJECTIVE:   CHIEF COMPLAINT / HPI:   Patient presents with concern of nausea and vomiting. She is [redacted]w[redacted]d today. She found out that she was pregnant on 3/5 and 1-2 weeks after this is when she started to develop nausea and vomiting. It does not seem to be worse but more frequent over time. 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 Denies abdominal pain, hematochezia, constipation, diarrhea, fever and chills. Denies vaginal bleeding, leakage or gush of fluid and pelvic pain.   OBJECTIVE:   BP 112/70   Pulse 87   Ht 5' (1.524 m)   Wt 178 lb (80.7 kg)   LMP 04/11/2022 (Exact Date)   SpO2 100%   BMI 34.76 kg/m   General: Patient well-appearing, in no acute distress. CV: RRR, no murmurs or gallops auscultated Resp: CTAB, no wheezing, rales or rhonchi noted Abdomen: soft, nontender, presence of bowel sounds Psych: mood appropriate, very pleasant   ASSESSMENT/PLAN:   Morning sickness -reassurance provided and discussed that this is normal during pregnancy -prescribed doxylamine-pyridoxine -continue prenatal vitamin daily -extensively discussed MAU precautions -work note provided -follow up for next prenatal visit on 4/19   Donney Dice, [place]

## 2022-06-16 NOTE — Telephone Encounter (Signed)
Attempted to call pt, no answer/VM set up. Will try again later. Mahala Rommel Kennon Holter, CMA

## 2022-06-16 NOTE — Assessment & Plan Note (Signed)
-  reassurance provided and discussed that this is normal during pregnancy -prescribed doxylamine-pyridoxine -continue prenatal vitamin daily -extensively discussed MAU precautions -work note provided -follow up for next prenatal visit on 4/19

## 2022-06-16 NOTE — Patient Instructions (Addendum)
It was great seeing you today!  Today we discussed your symptoms, nausea and vomiting is a normal part of pregnancy. I have prescribed doxylamine-pyridoxine, please take this as prescribed as we discussed.   Continue to take your prenatal vitamin daily.    Go to the MAU at [facility] if: You have pain in your lower abdomen or pelvic area Your water breaks.  Sometimes it is a big gush of fluid, sometimes it is just a trickle that keeps getting your underwear wet or running down your legs You have vaginal bleeding.   Please follow up at your next scheduled appointment on 4/19 at 2:15 pm, if anything arises between now and then, please don't hesitate to contact our office.  Thank you for allowing [place] to be a part of your medical care!  Thank you, Dr. Larae Grooms

## 2022-06-21 ENCOUNTER — Encounter: Payer: BC Managed Care – PPO | Admitting: Physician Assistant

## 2022-06-23 NOTE — Addendum Note (Signed)
Addended by: Reece Leader on: 06/23/2022 12:06 PM   Modules accepted: Level of Service

## 2022-06-28 ENCOUNTER — Ambulatory Visit (HOSPITAL_COMMUNITY)
Admission: RE | Admit: 2022-06-28 | Discharge: 2022-06-28 | Disposition: A | Discharge status: Home / Self Care | Payer: BC Managed Care – PPO | Source: Ambulatory Visit | Attending: Family Medicine | Admitting: Family Medicine

## 2022-06-28 DIAGNOSIS — O3481 Maternal care for other abnormalities of pelvic organs, first trimester: Secondary | ICD-10-CM | POA: Insufficient documentation

## 2022-06-28 DIAGNOSIS — N83201 Unspecified ovarian cyst, right side: Secondary | ICD-10-CM | POA: Insufficient documentation

## 2022-06-28 DIAGNOSIS — Z3689 Encounter for other specified antenatal screening: Secondary | ICD-10-CM | POA: Diagnosis not present

## 2022-06-28 DIAGNOSIS — Z3401 Encounter for supervision of normal first pregnancy, first trimester: Secondary | ICD-10-CM | POA: Diagnosis not present

## 2022-06-28 DIAGNOSIS — Z3A10 10 weeks gestation of pregnancy: Secondary | ICD-10-CM | POA: Insufficient documentation

## 2022-07-01 ENCOUNTER — Ambulatory Visit (INDEPENDENT_AMBULATORY_CARE_PROVIDER_SITE_OTHER): Payer: BC Managed Care – PPO | Admitting: Family Medicine

## 2022-07-01 ENCOUNTER — Other Ambulatory Visit: Payer: Self-pay

## 2022-07-01 VITALS — BP 134/89 | HR 113 | Wt 178.2 lb

## 2022-07-01 DIAGNOSIS — F4323 Adjustment disorder with mixed anxiety and depressed mood: Secondary | ICD-10-CM | POA: Diagnosis not present

## 2022-07-01 DIAGNOSIS — Z3401 Encounter for supervision of normal first pregnancy, first trimester: Secondary | ICD-10-CM

## 2022-07-01 NOTE — Patient Instructions (Signed)
It was wonderful to see you today.  Please bring ALL of your medications with you to every visit.   Today we talked about:  Your ultrasound looked great!   You should start taking aspirin 81 mg starting next week.Make sure you take your prenatal vitamin everyday.  Try taking only one nausea pill a day. If this does not work send me a message and I can send in some nausea medicine.   You can get genetic testing any time up to your third trimester  Please follow up in 1 month  Thank you for choosing [facility].   Please call [phone] with any questions about today's appointment.  Please be sure to schedule follow up at the front desk before you leave today.   Lockie Mola, MD  Family Medicine

## 2022-07-01 NOTE — Progress Notes (Unsigned)
  Patient Name: Julie Michael Date of Birth: 1995/07/21 [facility] Prenatal Visit  Brighton Delio is a 27 y.o. G1P0 at [redacted]w[redacted]d here for routine follow up. She is dated by LMP.  She reports {symptoms; pregnancy related:14538}.  She denies vaginal bleeding.  See flow sheet for details.  There were no vitals filed for this visit.   A/P: Pregnancy at [redacted]w[redacted]d.  Doing well.    Routine Prenatal Care:  Dating reviewed, dating tab is correct Fetal heart tones {appropriate:23337} Influenza vaccine {given:23340}  COVID vaccination was discussed and ***.  The patient has the following indication for screening preexisting diabetes: BMI > 25 and high risk ethnicity (Latino, [place] American, Native American, Mal[place] Islander, Asian Naval architect) . Anatomy ultrasound ordered to be scheduled at 18-20 weeks. Patient is interested in genetic screening. As she is past 13 weeks and 6 days, a {quad:23339::"Quad screen "} was offered.  Pregnancy education including expected weight gain in pregnancy, OTC medication use, continued use of prenatal vitamin, smoking cessation if applicable, and nutrition in pregnancy.   Bleeding and pain precautions reviewed. The patient has the following indications for aspirinto begin 81 mg at 12-16 weeks: One high risk condition: {fmcaspirinobhigh:26167} MORE than one moderate risk condition: {fmcaspirinobmoderate:26168} Aspirin {WAS/WAS NOT:[phone]::"was not"}  recommended today based upon above risk factors (one high risk condition or more than one moderate risk factor)   2. Pregnancy issues include the following and were addressed as appropriate today:  *** Problem list  and pregnancy box updated: {yes/no:20286::"Yes"}.   Follow up 4 weeks.

## 2022-07-04 ENCOUNTER — Encounter: Payer: Self-pay | Admitting: Family Medicine

## 2022-07-04 ENCOUNTER — Ambulatory Visit: Payer: Self-pay

## 2022-07-04 ENCOUNTER — Ambulatory Visit (INDEPENDENT_AMBULATORY_CARE_PROVIDER_SITE_OTHER): Payer: BC Managed Care – PPO | Admitting: Family Medicine

## 2022-07-04 VITALS — BP 112/71 | HR 86 | Temp 98.4°F | Ht 60.0 in | Wt 179.8 lb

## 2022-07-04 DIAGNOSIS — Z3491 Encounter for supervision of normal pregnancy, unspecified, first trimester: Secondary | ICD-10-CM

## 2022-07-04 DIAGNOSIS — Z3401 Encounter for supervision of normal first pregnancy, first trimester: Secondary | ICD-10-CM

## 2022-07-04 DIAGNOSIS — O210 Mild hyperemesis gravidarum: Secondary | ICD-10-CM

## 2022-07-04 DIAGNOSIS — B009 Herpesviral infection, unspecified: Secondary | ICD-10-CM

## 2022-07-04 MED ORDER — ASPIRIN 81 MG PO TBEC
81.0000 mg | DELAYED_RELEASE_TABLET | Freq: Every day | ORAL | 12 refills | Status: DC
Start: 2022-07-04 — End: 2023-02-08

## 2022-07-04 MED ORDER — VALACYCLOVIR HCL 500 MG PO TABS: 500.0000 mg | ORAL_TABLET | Freq: Every day | ORAL | 2 refills | Status: DC

## 2022-07-04 NOTE — Patient Instructions (Signed)
Please take the Valtrex  twice per day for 5 days. Then please take  daily for the remainder of your pregnancy. You can take this with your other medications.  We need to see you back for the sugar test to check for gestational diabetes and you can follow up with Dr. Marsh Dolly about those results.  Prenatal Classes Go to [email or web address] for more information on the pregnancy and child birth classes that [place] has to offer.   Pregnancy Related Return Precautions The follow are signs/symptoms that are abnormal in pregnancy and may require further evaluation by a physician: Go to the MAU at [facility] at [facility] if: You have cramping/contractions that do not go away with drinking water, especially if they are lasting 30 seconds to 1.5 minutes, coming and going every 5-10 minutes for an hour or more, or are getting stronger and you cannot walk or talk while having a contraction/cramp. Your water breaks.  Sometimes it is a big gush of fluid, sometimes it is just a trickle that keeps getting your underwear wet or running down your legs You have vaginal bleeding.    You do not feel your baby moving like normal.  If you do not, get something to eat and drink (something cold or something with sugar like peanut butter or juice) and lay down and focus on feeling your baby move. If your baby is still not moving like normal, you should go to MAU. You should feel your baby move 6 times in one hour, or 10 times in two hours. You have a persistent headache that does not go away with 1 g of Tylenol, vision changes, chest pain, difficulty breathing, severe pain in your right upper abdomen, worsening leg swelling- these can all be signs of high blood pressure in pregnancy and need to be evaluated by a provider immediately  These are all concerning in pregnancy and if you have any of these I recommend you call your PCP and present to the Maternity  Admissions Unit (map below) for further evaluation.  For any pregnancy-related emergencies, please go to the Maternity Admissions Unit in the Women's & Children's Center at [facility]. You will use hospital Entrance C.    Our clinic number is [phone].   Dr Miquel Dunn

## 2022-07-04 NOTE — Assessment & Plan Note (Addendum)
-  [redacted] weeks gestation per LMP, recommended starting ASA given risk factors. Rx sent to pharmacy -Per chart review due for early glucola screening given risk factors, rescheduled patient to 07/21/2022 for next OB visit with PCP and obtain glucola testing at that time

## 2022-07-04 NOTE — Assessment & Plan Note (Signed)
Symptoms improved with Diclegis. Declined Zofran due to concern for impact on fetus.

## 2022-07-04 NOTE — Telephone Encounter (Signed)
  Chief Complaint: Can you take valtrex while pregnant Symptoms: Out break Frequency:  Pertinent Negatives: Patient denies  Disposition: ED /[] Urgent Care (no appt availability in office) / Appointment(In office/virtual)/  [place] [facility]/ Home Care/ Refused Recommended Disposition /[] [place] [facility]/  Follow-up with PCP Additional Notes: PT called asking if she could take valtrex during pregnancy. Advised pt that PCP or OB should make those decisions. Asked if she was prescribed this medication by provider that knew she was pregnant. PT disconnected call.    Reason for Disposition  [1] Caller has URGENT medicine question about med that PCP or specialist prescribed AND [2] triager unable to answer question  Answer Assessment - Initial Assessment Questions 1. NAME of MEDICINE: "What medicine(s) are you calling about?"     Valtrex 2. QUESTION: "What is your question?" (e.g., double dose of medicine, side effect)     Is it safe to take during pregnancy 3. PRESCRIBER: "Who prescribed the medicine?" Reason: if prescribed by specialist, call should be referred to that group.      4. SYMPTOMS: "Do you have any symptoms?" If Yes, ask: "What symptoms are you having?"  "How bad are the symptoms (e.g., mild, moderate, severe)     Out break 5. PREGNANCY:  "Is there any chance that you are pregnant?" "When was your last menstrual period?"     yes  Protocols used: Medication Question Call-A-AH

## 2022-07-04 NOTE — Assessment & Plan Note (Signed)
Recent outbreak during first trimester pregnancy. 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  -Valacyclovir  BID x 5 days during outbreak -Discussed Valacyclovir maintenance with patient, will take  daily during pregnancy as desires vaginal delivery

## 2022-07-04 NOTE — Progress Notes (Signed)
    SUBJECTIVE:   CHIEF COMPLAINT / HPI:   HSV outbreak in pregnancy Reports recent HSV outbreak in the first trimester of pregnancy. H/o HSV but has not had an outbreak in a significant amount of time. Had some Valtrex at home but unsure if it is safe to take in pregnancy.  N/V in pregnancy States she has N/V throughout the day. The Diclegis helps but concerned it makes her groggy in the morning. Fatigue could also be due to waking up a lot at night to urinate. Looked up Zofran and prefers not to take it give some associated with fetal heart abnormalities.  PERTINENT  PMH / PSH: G1P0 @ 12 weeks today  OBJECTIVE:   BP 112/71   Pulse 86   Temp 98.4 F (36.9 C)   Ht 5' (1.524 m)   Wt 179 lb 12.8 oz (81.6 kg)   LMP 04/11/2022 (Exact Date)   SpO2 100%   BMI 35.11 kg/m    General: NAD, pleasant, able to participate in exam Respiratory: Normal WOB on RA Extremities: No edema or cyanosis. Skin: Warm and dry, no rashes noted Neuro: Alert, no obvious focal deficits Psych: Normal affect and mood  ASSESSMENT/PLAN:   Morning sickness Symptoms improved with Diclegis. Declined Zofran due to concern for impact on fetus.  HSV infection Recent outbreak during first trimester pregnancy. 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  -Valacyclovir  BID x 5 days during outbreak -Discussed Valacyclovir maintenance with patient, will take  daily during pregnancy as desires vaginal delivery  Supervision of low-risk first pregnancy -[redacted] weeks gestation per LMP, recommended starting ASA given risk factors. Rx sent to pharmacy -Per chart review due for early glucola screening given risk factors, rescheduled patient to 07/21/2022 for next OB visit with PCP and obtain glucola testing at that time   Dr. Elberta Fortis, DO [facility]

## 2022-07-06 DIAGNOSIS — F4323 Adjustment disorder with mixed anxiety and depressed mood: Secondary | ICD-10-CM | POA: Diagnosis not present

## 2022-07-18 ENCOUNTER — Ambulatory Visit (INDEPENDENT_AMBULATORY_CARE_PROVIDER_SITE_OTHER): Payer: BC Managed Care – PPO | Admitting: Family Medicine

## 2022-07-18 VITALS — BP 110/68 | HR 61 | Wt 183.8 lb

## 2022-07-18 DIAGNOSIS — Z3491 Encounter for supervision of normal pregnancy, unspecified, first trimester: Secondary | ICD-10-CM | POA: Diagnosis not present

## 2022-07-18 NOTE — Progress Notes (Signed)
  Patient Name: Julie Michael Date of Birth: 24-Mar-1995 [facility] Prenatal Visit  Julie Michael is a 27 y.o. G1P0 at [redacted]w[redacted]d here for routine follow up. She is dated by LMP.  She reports  she continues to have nausea and vomiting. This has been tolerable with the doxylamine-pyridoxine. Additionally, she mentions that she has been having occasional cramping/sharp pain at her lower abdomen along her hip creases. She says that it sometimes happens when she moves or when she is more dehydrated  .  She denies vaginal bleeding or leakage of fluid.  See flow sheet for details.  Vitals:   07/18/22 1531  BP: 110/68  Pulse: 61     A/P: Pregnancy at [redacted]w[redacted]d.  Doing well.    Routine Prenatal Care:  Dating reviewed, dating tab is correct Fetal heart tones Appropriate Influenza vaccine not administered as not influenza season.   COVID vaccination was discussed and declined at this time.  The patient has the following indication for screening preexisting diabetes: BMI > 25 and high risk ethnicity (Latino, [place] American, Native American, Mal[place] Islander, Asian Naval architect) . 1hr gtt ordered.  Anatomy ultrasound ordered to be scheduled at 18-20 weeks. (June 11th)  Patient  is still thinking if she is   interested in genetic screening. Will follow up  Pregnancy education including expected weight gain in pregnancy, OTC medication use, continued use of prenatal vitamin, smoking cessation if applicable, and nutrition in pregnancy.   Bleeding and pain precautions reviewed. The patient has the following indicationsno single high risk condition  for aspirinto begin 81 mg at 12-16 weeks: One high risk condition: no single high risk condition  MORE than one moderate risk condition: nulliparity, obesity, and identifies as African American  Aspirin was  recommended today based upon above risk factors (one high risk condition or more than one moderate risk factor)   2. Pregnancy issues include  the following and were addressed as appropriate today:  Nausea - continue doxylamine pyridoxine  Cramping/ lower abdominal pain - Could be 2 different types of pain. Could be both round ligament pain as well as possible contractions when dehydrated. Encouraged staying hydrated and to let me know if symptoms continue.  Problem list  and pregnancy box updated: Yes.   Follow up 4 weeks.

## 2022-07-18 NOTE — Patient Instructions (Addendum)
It was wonderful to see you today.  Please bring ALL of your medications with you to every visit.   Today we talked about:  Vomiting - Continue using your diclegis  Continue taking aspirin and your prenatal vitamin  You do not have to continue valtrex if you are not having a flare until 36 weeks.   Thank you for choosing [facility].   Please call [phone] with any questions about today's appointment.  Please be sure to schedule follow up at the front desk before you leave today.   Lockie Mola, MD  Family Medicine    Please make a follow up appointment in 4 weeks.  Prenatal Classes Go to [email or web address] for more information on the pregnancy and child birth classes that [place] has to offer.   Pregnancy Related Return Precautions The follow are signs/symptoms that are abnormal in pregnancy and may require further evaluation by a physician: Go to the MAU at [facility] Center at [facility] if: You have cramping/contractions that do not go away with drinking water, especially if they are lasting 30 seconds to 1.5 minutes, coming and going every 5-10 minutes for an hour or more, or are getting stronger and you cannot walk or talk while having a contraction/cramp. Your water breaks.  Sometimes it is a big gush of fluid, sometimes it is just a trickle that keeps getting your underwear wet or running down your legs You have vaginal bleeding.    You do not feel your baby moving like normal.  If you do not, get something to eat and drink (something cold or something with sugar like peanut butter or juice) and lay down and focus on feeling your baby move. If your baby is still not moving like normal, you should go to MAU. You should feel your baby move 6 times in one hour, or 10 times in two hours. You have a persistent headache that does not go away with 1 g of Tylenol, vision changes, chest pain, difficulty breathing,  severe pain in your right upper abdomen, worsening leg swelling- these can all be signs of high blood pressure in pregnancy and need to be evaluated by a provider immediately  These are all concerning in pregnancy and if you have any of these I recommend you call your PCP and present to the Maternity Admissions Unit (map below) for further evaluation.  For any pregnancy-related emergencies, please go to the Maternity Admissions Unit in the Women's & Children's Center at [facility]. You will use hospital Entrance C.    Our clinic number is [phone].   Dr Miquel Dunn

## 2022-07-19 LAB — GLUCOSE TOLERANCE, 1 HOUR: Glucose, 1Hr PP: 103 mg/dL (ref 70–199)

## 2022-07-20 DIAGNOSIS — F4323 Adjustment disorder with mixed anxiety and depressed mood: Secondary | ICD-10-CM | POA: Diagnosis not present

## 2022-07-26 DIAGNOSIS — F4323 Adjustment disorder with mixed anxiety and depressed mood: Secondary | ICD-10-CM | POA: Diagnosis not present

## 2022-08-01 ENCOUNTER — Encounter: Payer: Self-pay | Admitting: Family Medicine

## 2022-08-01 ENCOUNTER — Telehealth: Payer: Self-pay | Admitting: Family Medicine

## 2022-08-01 NOTE — Telephone Encounter (Signed)
Patient is calling concerning an appointment that was cancelled. She had an appointment scheduled for today. It was her only day off that she would be able to make appointment.   She said she went to check her mychart to confirm appointment time and her appointment was no longer listed. She said that there was no message sent and there is nothing documented in the chart. It looks like Dr. Ardyth Harps cancelled the appointment. Patient would like to know why.   Patient would also like for Dr. Marsh Dolly to call her. The best call back number is [phone]

## 2022-08-02 DIAGNOSIS — F4323 Adjustment disorder with mixed anxiety and depressed mood: Secondary | ICD-10-CM | POA: Diagnosis not present

## 2022-08-02 NOTE — Telephone Encounter (Signed)
Called patient as I received a message that she would like a call from me. Patient did not pick up. Left message to call back or send mychart message.

## 2022-08-09 DIAGNOSIS — F4323 Adjustment disorder with mixed anxiety and depressed mood: Secondary | ICD-10-CM | POA: Diagnosis not present

## 2022-08-15 ENCOUNTER — Other Ambulatory Visit: Payer: Self-pay

## 2022-08-15 ENCOUNTER — Encounter (HOSPITAL_COMMUNITY): Payer: Self-pay | Admitting: Obstetrics & Gynecology

## 2022-08-15 ENCOUNTER — Inpatient Hospital Stay (HOSPITAL_COMMUNITY)
Admission: AD | Admit: 2022-08-15 | Discharge: 2022-08-16 | Disposition: A | Discharge status: Home / Self Care | Payer: BC Managed Care – PPO | Attending: Obstetrics & Gynecology | Admitting: Obstetrics & Gynecology

## 2022-08-15 DIAGNOSIS — O210 Mild hyperemesis gravidarum: Secondary | ICD-10-CM | POA: Diagnosis not present

## 2022-08-15 DIAGNOSIS — B3731 Acute candidiasis of vulva and vagina: Secondary | ICD-10-CM | POA: Diagnosis not present

## 2022-08-15 DIAGNOSIS — Z3A18 18 weeks gestation of pregnancy: Secondary | ICD-10-CM | POA: Diagnosis not present

## 2022-08-15 DIAGNOSIS — R519 Headache, unspecified: Secondary | ICD-10-CM | POA: Insufficient documentation

## 2022-08-15 DIAGNOSIS — O98812 Other maternal infectious and parasitic diseases complicating pregnancy, second trimester: Secondary | ICD-10-CM | POA: Diagnosis not present

## 2022-08-15 DIAGNOSIS — M545 Low back pain, unspecified: Secondary | ICD-10-CM | POA: Insufficient documentation

## 2022-08-15 DIAGNOSIS — O23592 Infection of other part of genital tract in pregnancy, second trimester: Secondary | ICD-10-CM | POA: Insufficient documentation

## 2022-08-15 DIAGNOSIS — O26892 Other specified pregnancy related conditions, second trimester: Secondary | ICD-10-CM | POA: Diagnosis not present

## 2022-08-15 DIAGNOSIS — R109 Unspecified abdominal pain: Secondary | ICD-10-CM | POA: Insufficient documentation

## 2022-08-15 DIAGNOSIS — O98512 Other viral diseases complicating pregnancy, second trimester: Secondary | ICD-10-CM | POA: Insufficient documentation

## 2022-08-15 LAB — URINALYSIS, ROUTINE W REFLEX MICROSCOPIC
Bilirubin Urine: NEGATIVE
Glucose, UA: NEGATIVE mg/dL
Hgb urine dipstick: NEGATIVE
Ketones, ur: NEGATIVE mg/dL
Nitrite: NEGATIVE
Protein, ur: NEGATIVE mg/dL
Specific Gravity, Urine: 1.009 (ref 1.005–1.030)
pH: 6 (ref 5.0–8.0)

## 2022-08-15 MED ORDER — METOCLOPRAMIDE HCL 10 MG PO TABS
10.0000 mg | ORAL_TABLET | Freq: Once | ORAL | Status: AC
  Administered 2022-08-15: 10 mg via ORAL
  Filled 2022-08-15: qty 1

## 2022-08-15 MED ORDER — ACETAMINOPHEN-CAFFEINE 500-65 MG PO TABS
2.0000 | ORAL_TABLET | Freq: Once | ORAL | Status: AC
  Administered 2022-08-15: 2 via ORAL
  Filled 2022-08-15: qty 2

## 2022-08-15 NOTE — MAU Note (Signed)
..  Julie Michael is a 27 y.o. at [redacted]w[redacted]d here in MAU reporting: Cramping throughout the weekend and migraine that began today. Has not taken anything for the migraine. Denies vaginal bleeding or leaking of fluid.   Pain score: 10/10 Vitals:   08/15/22 2121  BP: 126/77  Pulse: (!) 103  Resp: 17  Temp: 99.2 F (37.3 C)  SpO2: 100%     FHT:164 Lab orders placed from triage:  UA

## 2022-08-16 ENCOUNTER — Other Ambulatory Visit (HOSPITAL_COMMUNITY): Payer: Self-pay

## 2022-08-16 ENCOUNTER — Other Ambulatory Visit: Payer: Self-pay

## 2022-08-16 DIAGNOSIS — Z3A18 18 weeks gestation of pregnancy: Secondary | ICD-10-CM | POA: Diagnosis not present

## 2022-08-16 DIAGNOSIS — O26892 Other specified pregnancy related conditions, second trimester: Secondary | ICD-10-CM

## 2022-08-16 DIAGNOSIS — B3731 Acute candidiasis of vulva and vagina: Secondary | ICD-10-CM | POA: Diagnosis not present

## 2022-08-16 DIAGNOSIS — R519 Headache, unspecified: Secondary | ICD-10-CM

## 2022-08-16 DIAGNOSIS — O98812 Other maternal infectious and parasitic diseases complicating pregnancy, second trimester: Secondary | ICD-10-CM | POA: Diagnosis not present

## 2022-08-16 DIAGNOSIS — F4323 Adjustment disorder with mixed anxiety and depressed mood: Secondary | ICD-10-CM | POA: Diagnosis not present

## 2022-08-16 LAB — WET PREP, GENITAL
Sperm: NONE SEEN
Trich, Wet Prep: NONE SEEN
WBC, Wet Prep HPF POC: <10 (ref <10)

## 2022-08-16 LAB — GC/CHLAMYDIA PROBE AMP (~~LOC~~) NOT AT ARMC
Chlamydia: NEGATIVE
Comment: NEGATIVE
Comment: NORMAL
Neisseria Gonorrhea: NEGATIVE

## 2022-08-16 MED ORDER — DOXYLAMINE-PYRIDOXINE 10-10 MG PO TBEC
DELAYED_RELEASE_TABLET | ORAL | 2 refills | Status: DC
Start: 2022-08-16 — End: 2022-10-17
  Filled 2022-08-16: qty 20, 5d supply, fill #0
  Filled 2022-08-16: qty 100, 25d supply, fill #0
  Filled 2022-10-15: qty 120, 30d supply, fill #1

## 2022-08-16 MED ORDER — TERCONAZOLE 0.4 % VA CREA
1.0000 | TOPICAL_CREAM | Freq: Every day | VAGINAL | 0 refills | Status: DC
  Filled 2022-08-16: qty 45, 7d supply, fill #0

## 2022-08-16 NOTE — MAU Provider Note (Signed)
History     CSN: [id number]  Arrival date and time: 08/15/22 2043   Event Date/Time   First Provider Initiated Contact with Patient 08/16/22 0001      Chief Complaint  Patient presents with   Abdominal Pain   Headache   HPI  Julie Michael is a 27 y.o. G1P0 at [redacted]w[redacted]d who presents for evaluation of a headache and abdominal pain. Patient reports she is getting frequent headaches since she has been pregnant. She reports it feels like a tight band around her head. Patient rates the pain as a 8/10 and has not tried anything for the pain. She also reports intermittent lower abdominal and low back pain. She reports it comes randomly and she cannot tell if anything makes it worse or better. She also thinks she has a yeast infection.   She denies any vaginal bleeding and leaking of fluid. Denies any constipation, diarrhea or any urinary complaints.   OB History     Gravida  1   Para      Term      Preterm      AB      Living         SAB      IAB      Ectopic      Multiple      Live Births              Past Medical History:  Diagnosis Date   Herpes    Yeast infection     Past Surgical History:  Procedure Laterality Date   WISDOM TOOTH EXTRACTION      Family History  Problem Relation Age of Onset   Diabetes Other    Hypertension Other     Social History   Tobacco Use   Smoking status: Never    Passive exposure: Never   Smokeless tobacco: Never  Vaping Use   Vaping Use: Never used  Substance Use Topics   Alcohol use: Yes    Comment: occas   Drug use: No    Allergies: No Known Allergies  No medications prior to admission.    Review of Systems  Constitutional: Negative.  Negative for fatigue and fever.  HENT: Negative.    Respiratory: Negative.  Negative for shortness of breath.   Cardiovascular: Negative.  Negative for chest pain.  Gastrointestinal:  Positive for abdominal pain. Negative for constipation, diarrhea, nausea and vomiting.   Genitourinary:  Positive for vaginal discharge. Negative for dysuria and vaginal bleeding.  Musculoskeletal:  Positive for back pain.  Neurological:  Positive for headaches. Negative for dizziness.   Physical Exam   Blood pressure (!) 107/53, pulse 82, temperature 97.9 F (36.6 C), temperature source Oral, resp. rate 20, height 5' (1.524 m), weight 84.9 kg, last menstrual period 04/11/2022, SpO2 99 %.  Patient Vitals for the past 24 hrs:  BP Temp Temp src Pulse Resp SpO2 Height Weight  08/16/22 0053 (!) 107/53 97.9 F (36.6 C) Oral 82 20 99 % -- --  08/15/22 2342 120/71 98.2 F (36.8 C) Oral 99 18 95 % -- --  08/15/22 2121 126/77 99.2 F (37.3 C) Oral (!) 103 17 100 % 5' (1.524 m) 84.9 kg    Physical Exam Vitals and nursing note reviewed.  Constitutional:      General: She is not in acute distress.    Appearance: She is well-developed.  HENT:     Head: Normocephalic.  Eyes:     Pupils: Pupils are equal,  round, and reactive to light.  Cardiovascular:     Rate and Rhythm: Normal rate and regular rhythm.     Heart sounds: Normal heart sounds.  Pulmonary:     Effort: Pulmonary effort is normal. No respiratory distress.     Breath sounds: Normal breath sounds.  Abdominal:     General: Bowel sounds are normal. There is no distension.     Palpations: Abdomen is soft.     Tenderness: There is no abdominal tenderness.  Skin:    General: Skin is warm and dry.  Neurological:     Mental Status: She is alert and oriented to person, place, and time.  Psychiatric:        Mood and Affect: Mood normal.        Behavior: Behavior normal.        Thought Content: Thought content normal.        Judgment: Judgment normal.     FHT: 164 bpm  Dilation: Closed Exam by:: Cleone Slim, CNM   MAU Course  Procedures  Results for orders placed or performed during the hospital encounter of 08/15/22 (from the past 24 hour(s))  Urinalysis, Routine w reflex microscopic -Urine, Clean Catch      Status: Abnormal   Collection Time: 08/15/22  9:24 PM  Result Value Ref Range   Color, Urine YELLOW YELLOW   APPearance CLEAR CLEAR   Specific Gravity, Urine 1.009 1.005 - 1.030   pH 6.0 5.0 - 8.0   Glucose, UA NEGATIVE NEGATIVE mg/dL   Hgb urine dipstick NEGATIVE NEGATIVE   Bilirubin Urine NEGATIVE NEGATIVE   Ketones, ur NEGATIVE NEGATIVE mg/dL   Protein, ur NEGATIVE NEGATIVE mg/dL   Nitrite NEGATIVE NEGATIVE   Leukocytes,Ua TRACE (A) NEGATIVE   RBC / HPF 0-5 0 - 5 RBC/hpf   WBC, UA 0-5 0 - 5 WBC/hpf   Bacteria, UA FEW (A) NONE SEEN   Squamous Epithelial / HPF 0-5 0 - 5 /HPF   Mucus PRESENT   Wet prep, genital     Status: Abnormal   Collection Time: 08/16/22 12:10 AM  Result Value Ref Range   Yeast Wet Prep HPF POC PRESENT (A) NONE SEEN   Trich, Wet Prep NONE SEEN NONE SEEN   Clue Cells Wet Prep HPF POC PRESENT (A) NONE SEEN   WBC, Wet Prep HPF POC <10 <10   Sperm NONE SEEN      MDM Labs ordered and reviewed.   UA Wet prep and gc/chlamydia Excedrin Tension/Reglan   Patient reports improvement of HA.  Assessment and Plan   1. Candidiasis of vagina during pregnancy   2. [redacted] weeks gestation of pregnancy   3. Pregnancy headache in second trimester   4. Morning sickness     -Discharge home in stable condition -Rx for terazol and refill of diclegis sent to pharmacy -Second trimester precautions discussed -Patient advised to follow-up with OB as scheduled for prenatal care -Patient may return to MAU as needed or if her condition were to change or worsen  Rolm Bookbinder, CNM 08/16/2022, 12:01 AM

## 2022-08-16 NOTE — Discharge Instructions (Signed)

## 2022-08-19 ENCOUNTER — Encounter: Payer: Self-pay | Admitting: *Deleted

## 2022-08-23 ENCOUNTER — Ambulatory Visit: Payer: BC Managed Care – PPO | Admitting: *Deleted

## 2022-08-23 ENCOUNTER — Ambulatory Visit: Payer: BC Managed Care – PPO | Attending: Family Medicine

## 2022-08-23 VITALS — BP 119/67 | HR 90

## 2022-08-23 DIAGNOSIS — F4323 Adjustment disorder with mixed anxiety and depressed mood: Secondary | ICD-10-CM | POA: Diagnosis not present

## 2022-08-23 DIAGNOSIS — B009 Herpesviral infection, unspecified: Secondary | ICD-10-CM | POA: Insufficient documentation

## 2022-08-23 DIAGNOSIS — O99212 Obesity complicating pregnancy, second trimester: Secondary | ICD-10-CM | POA: Diagnosis not present

## 2022-08-23 DIAGNOSIS — O98512 Other viral diseases complicating pregnancy, second trimester: Secondary | ICD-10-CM | POA: Diagnosis not present

## 2022-08-23 DIAGNOSIS — Z363 Encounter for antenatal screening for malformations: Secondary | ICD-10-CM | POA: Insufficient documentation

## 2022-08-23 DIAGNOSIS — Z3491 Encounter for supervision of normal pregnancy, unspecified, first trimester: Secondary | ICD-10-CM | POA: Insufficient documentation

## 2022-08-23 DIAGNOSIS — Z3A19 19 weeks gestation of pregnancy: Secondary | ICD-10-CM | POA: Diagnosis not present

## 2022-08-24 ENCOUNTER — Other Ambulatory Visit: Payer: Self-pay | Admitting: *Deleted

## 2022-08-24 DIAGNOSIS — O99212 Obesity complicating pregnancy, second trimester: Secondary | ICD-10-CM

## 2022-08-25 DIAGNOSIS — F4323 Adjustment disorder with mixed anxiety and depressed mood: Secondary | ICD-10-CM | POA: Diagnosis not present

## 2022-10-07 ENCOUNTER — Ambulatory Visit: Payer: BC Managed Care – PPO | Admitting: Student

## 2022-10-07 VITALS — BP 106/75 | HR 102 | Wt 191.0 lb

## 2022-10-07 DIAGNOSIS — Z3402 Encounter for supervision of normal first pregnancy, second trimester: Secondary | ICD-10-CM | POA: Diagnosis not present

## 2022-10-07 NOTE — Progress Notes (Signed)
  [facility] Prenatal Visit  Julie Michael is a 27 y.o. G1P0 at [redacted]w[redacted]d here for routine follow up. She is dated by LMP.  She reports nausea and vomiting. She reports fetal movement. Denies vaginal bleeding, loss of fluid, or contractions. Admits to increased clear discharge with no odor. See flow sheet for details.  Seen at MAU 08/16/2022 and treated for vaginal candidiasis.   Wet prep was positive for BV but I do not see where she was treated with metronidazole but she thinks she was.  Has been battling with nausea and vomiting throughout pregnancy, she continues to take doxylamine-pyridoxine which helps.  A/P: Pregnancy at [redacted]w[redacted]d.  Doing well.   Dating reviewed, dating tab is correct Anatomy scan completed on 08/23/2022 which was non concerning.  Due to maternal obesity, she has a growth [place] scheduled next month. Fetal heart tones Appropriate Fundal height >2 cm from expected size given dating, discussed with preceptor.  Influenza vaccine not administered as not influenza season.   COVID vaccination discussed and declined at previous visit Screening for gestational diabetes completed today, ordered as send out. Will follow  Pregnancy education completed including: fetal growth, breastfeeding,  The patient does not have a history of Cesarean delivery and no referral to [facility] for [facility] is indicated I do not see where patient is scheduled for [facility] faculty clinic but next OB visit is scheduled with PCP Dr. Marsh Dolly on 8/20. Preterm labor, bleeding, and pain precautions given.  She is taking prenatal vitamin and aspirin daily   2. Pregnancy issues include the following and were addressed as appropriate today:   HSV-s/p outbreak during first trimester which was treated with Valtrex she plans to start maintenance therapy at 36 weeks On chart review, patient was positive for BV at MAU visit and is having increased vaginal discharge without odor.  Discharge could be normal.   As patient thinks she may have been treated for BV already, she prefers to return on Monday with access to care for a wet prep.  If positive for BV she needs treatment with metronidazole. Fundal height measured at 31 today although she is only [redacted] weeks pregnant.  Last ultrasound showed normal growth.  She does have growth ultrasound scheduled for 8/13.   Problem list and pregnancy box updated: Yes.   Follow up 4 weeks.

## 2022-10-10 ENCOUNTER — Ambulatory Visit: Payer: Self-pay

## 2022-10-10 ENCOUNTER — Telehealth: Payer: Self-pay | Admitting: Student

## 2022-10-10 DIAGNOSIS — O24419 Gestational diabetes mellitus in pregnancy, unspecified control: Secondary | ICD-10-CM

## 2022-10-10 NOTE — Telephone Encounter (Addendum)
Spoke with patient on the phone regarding failed 1 hr GTT. 3 hr GTT scheduled.

## 2022-10-10 NOTE — Progress Notes (Deleted)
    SUBJECTIVE:   CHIEF COMPLAINT / HPI:   ***  PERTINENT  PMH / PSH: ***  OBJECTIVE:   LMP 04/11/2022 (Exact Date)  ***  General: NAD, pleasant, able to participate in exam Cardiac: RRR, no murmurs. Respiratory: CTAB, normal effort, No wheezes, rales or rhonchi Abdomen: Bowel sounds present, nontender, nondistended, no hepatosplenomegaly. Extremities: no edema or cyanosis. Skin: warm and dry, no rashes noted Neuro: alert, no obvious focal deficits Psych: Normal affect and mood  ASSESSMENT/PLAN:   No problem-specific Assessment & Plan notes found for this encounter.    AVS Blood sugar goals: Fasting <90 and 2 hr after eating <120.  Check blood sugar every morning before eating and 2 hours after eating lunch. Write them down and bring to next appointment   Dr. Erick Alley, DO  [facility]    {    This will disappear when note is signed, click to select method of visit    :1}

## 2022-10-11 ENCOUNTER — Ambulatory Visit: Payer: Self-pay

## 2022-10-11 DIAGNOSIS — O24419 Gestational diabetes mellitus in pregnancy, unspecified control: Secondary | ICD-10-CM

## 2022-10-17 ENCOUNTER — Other Ambulatory Visit: Payer: Self-pay

## 2022-10-17 ENCOUNTER — Other Ambulatory Visit: Payer: Self-pay | Admitting: *Deleted

## 2022-10-17 ENCOUNTER — Other Ambulatory Visit: Payer: Self-pay | Admitting: Student

## 2022-10-17 ENCOUNTER — Encounter: Payer: Self-pay | Admitting: *Deleted

## 2022-10-17 DIAGNOSIS — O210 Mild hyperemesis gravidarum: Secondary | ICD-10-CM

## 2022-10-17 DIAGNOSIS — Z3402 Encounter for supervision of normal first pregnancy, second trimester: Secondary | ICD-10-CM

## 2022-10-17 MED ORDER — DOXYLAMINE-PYRIDOXINE 10-10 MG PO TBEC
DELAYED_RELEASE_TABLET | ORAL | 2 refills | Status: DC
Start: 2022-10-17 — End: 2022-10-24

## 2022-10-18 ENCOUNTER — Other Ambulatory Visit: Payer: Self-pay | Admitting: Family Medicine

## 2022-10-18 ENCOUNTER — Telehealth: Payer: Self-pay | Admitting: *Deleted

## 2022-10-18 ENCOUNTER — Other Ambulatory Visit (INDEPENDENT_AMBULATORY_CARE_PROVIDER_SITE_OTHER): Payer: BC Managed Care – PPO

## 2022-10-18 ENCOUNTER — Other Ambulatory Visit: Payer: Self-pay

## 2022-10-18 DIAGNOSIS — Z3402 Encounter for supervision of normal first pregnancy, second trimester: Secondary | ICD-10-CM | POA: Diagnosis not present

## 2022-10-18 LAB — POCT CBG (FASTING - GLUCOSE)-MANUAL ENTRY: Glucose Fasting, POC: 112 mg/dL — AB (ref 70–99)

## 2022-10-18 MED ORDER — ONDANSETRON 4 MG PO TBDP: 4.0000 mg | ORAL_TABLET | Freq: Once | ORAL | 0 refills | Status: AC

## 2022-10-18 NOTE — Telephone Encounter (Signed)
Called patient to inform her zofran was sent to her pharmacy and to take 1 hour prior to coming in next Tuesday for a second attempt at the 3 hr gtt. Patient agreed to plan. Julie Michael Keanthony Poole

## 2022-10-18 NOTE — Progress Notes (Signed)
Patient vomited prior to completing the first hour of a 3 hr gtt. Test cancelled per protocol. Will notify MD to call in zofran to patient's pharmacy to take 1 hr prior to 2nd attempt and have patient schedule another lab appointment. Doris Cheadle Ulice Follett

## 2022-10-18 NOTE — Addendum Note (Signed)
Addended by: Latrelle Dodrill on: 10/18/2022 02:49 PM   Modules accepted: Orders

## 2022-10-24 ENCOUNTER — Other Ambulatory Visit: Payer: Self-pay

## 2022-10-24 DIAGNOSIS — O210 Mild hyperemesis gravidarum: Secondary | ICD-10-CM

## 2022-10-25 ENCOUNTER — Other Ambulatory Visit: Payer: BC Managed Care – PPO

## 2022-10-25 ENCOUNTER — Ambulatory Visit: Payer: BC Managed Care – PPO | Admitting: *Deleted

## 2022-10-25 ENCOUNTER — Encounter: Payer: Self-pay | Admitting: *Deleted

## 2022-10-25 ENCOUNTER — Other Ambulatory Visit: Payer: Self-pay | Admitting: *Deleted

## 2022-10-25 ENCOUNTER — Ambulatory Visit (INDEPENDENT_AMBULATORY_CARE_PROVIDER_SITE_OTHER): Payer: BC Managed Care – PPO | Admitting: Student

## 2022-10-25 ENCOUNTER — Ambulatory Visit: Payer: BC Managed Care – PPO | Attending: Obstetrics

## 2022-10-25 VITALS — BP 102/64 | HR 93 | Wt 192.0 lb

## 2022-10-25 DIAGNOSIS — Z3A28 28 weeks gestation of pregnancy: Secondary | ICD-10-CM

## 2022-10-25 DIAGNOSIS — O219 Vomiting of pregnancy, unspecified: Secondary | ICD-10-CM

## 2022-10-25 DIAGNOSIS — O99213 Obesity complicating pregnancy, third trimester: Secondary | ICD-10-CM | POA: Diagnosis not present

## 2022-10-25 DIAGNOSIS — Z3402 Encounter for supervision of normal first pregnancy, second trimester: Secondary | ICD-10-CM | POA: Diagnosis not present

## 2022-10-25 DIAGNOSIS — H6121 Impacted cerumen, right ear: Secondary | ICD-10-CM | POA: Diagnosis not present

## 2022-10-25 DIAGNOSIS — O99212 Obesity complicating pregnancy, second trimester: Secondary | ICD-10-CM | POA: Insufficient documentation

## 2022-10-25 DIAGNOSIS — E669 Obesity, unspecified: Secondary | ICD-10-CM

## 2022-10-25 MED ORDER — ONDANSETRON 4 MG PO TBDP
4.0000 mg | ORAL_TABLET | Freq: Once | ORAL | Status: AC
Start: 2022-10-25 — End: 2022-10-25
  Administered 2022-10-25: 4 mg via ORAL

## 2022-10-25 NOTE — Assessment & Plan Note (Signed)
Provided irrigation, TMs clear bilaterally.  Discussed ear care.  Follow-up as necessary.

## 2022-10-25 NOTE — Progress Notes (Cosign Needed Addendum)
    SUBJECTIVE:   CHIEF COMPLAINT / HPI:   Ear fullness Patient endorses RIGHT ear fullness starting approximately 1 month ago.  She has been using hydroperoxide to clean her ears, denies using objects to clean ear.  No pain, no tinnitus, no sore throat, no rhinorrhea nor congestion.  Sounds are muffled, but no hearing loss.  Reports she is never had problems with her ears in adulthood.  In childhood, did have to have ears cleaned by irrigation on occasion.  PERTINENT  PMH / PSH: [redacted] weeks pregnant  OBJECTIVE:   BP 102/64   Pulse 93   Wt 192 lb (87.1 kg)   LMP 04/11/2022 (Exact Date)   SpO2 98%   BMI 37.50 kg/m    General: NAD, pleasant HEENT: Normocephalic, atraumatic head. Normal external ear. Impacted cerumen on right, and nearly occluded canal on left. EOM intact and normal conjunctiva BL. Normal external nose. Throat not erythematous, no exudate, no deviation. Normal dentition.  Cardio: RRR, no MRG. Cap Refill <2s. Respiratory: CTAB, normal wob on RA Skin: Warm and dry  Exam after irrigation: Patient reports improved hearing.  TMs clear bilaterally.   ASSESSMENT/PLAN:   Impacted cerumen of right ear Assessment & Plan: Provided irrigation, TMs clear bilaterally.  Discussed ear care.  Follow-up as necessary.   Nausea and vomiting during pregnancy  Assessment & Plan:  Patient here for lab visit, 3-hour GTT.  She is experiencing nausea, gave Zofran x 2. -     Ondansetron   Tiffany Kocher, DO [facility]

## 2022-10-26 ENCOUNTER — Telehealth: Payer: Self-pay | Admitting: Student

## 2022-10-26 DIAGNOSIS — O24419 Gestational diabetes mellitus in pregnancy, unspecified control: Secondary | ICD-10-CM

## 2022-10-26 MED ORDER — DOXYLAMINE-PYRIDOXINE 10-10 MG PO TBEC
DELAYED_RELEASE_TABLET | ORAL | 2 refills | Status: DC
Start: 2022-10-26 — End: 2022-11-10

## 2022-10-26 MED ORDER — BLOOD GLUCOSE MONITOR KIT
PACK | 0 refills | Status: DC
Start: 2022-10-26 — End: 2022-10-27

## 2022-10-26 NOTE — Addendum Note (Signed)
Addended by: Howard Pouch on: 10/26/2022 05:02 PM   Modules accepted: Orders

## 2022-10-26 NOTE — Telephone Encounter (Addendum)
Called Julie Michael to discuss results of 3 hr GTT which is positive for gestational diabetes.  -Glucose monitor sent to pharmacy -Julie Michael instructed to check fasting glucose and 2 hr post prandial glucose, write these down and bring to next apt with Dr. Marsh Dolly on  8/20.  -Discussed fasting goal of <90 and 2 hr post prandial goal of <120. If >50% above goal, consider starting metformin  -Referral to OBGYN placed -Discussed increasing lean meats, veggies and whole fruits and avoiding meals high in carbohydrates, sugary beverages and sweets. Julie Michael voiced understanding and all questions answered.

## 2022-10-27 MED ORDER — BLOOD GLUCOSE MONITOR KIT
PACK | 0 refills | Status: DC
Start: 2022-10-27 — End: 2023-01-09

## 2022-10-27 NOTE — Telephone Encounter (Signed)
Patient calls nurse line in regards to DM supplies.   She reports she went to the pharmacy, however they never received prescription.   It appears the prescription was set to print.   Will resend to [employer].

## 2022-10-27 NOTE — Telephone Encounter (Signed)
Prescription continuing to set to print.   I called [facility] and phoned in prescription. Asked to have them dispense whichever brand her insurance prefers.   Patient would like to know the amount of sugar in grams she can consume per day. She reports it would be easier for her to follow food labels.   Will forward to provider for recommendation.

## 2022-10-27 NOTE — Addendum Note (Signed)
Addended by: Steva Colder on: 10/27/2022 01:50 PM   Modules accepted: Orders

## 2022-10-28 ENCOUNTER — Telehealth: Payer: Self-pay | Admitting: Student

## 2022-10-28 ENCOUNTER — Encounter: Payer: Self-pay | Admitting: Student

## 2022-10-28 NOTE — Telephone Encounter (Signed)
Called patient to respond to RN message.  Answered all questions about gestational diabetes.  Sent patient MyChart message with documents on pathology of gestational by diabetes and management of gestational diabetes.

## 2022-11-01 ENCOUNTER — Ambulatory Visit: Payer: BC Managed Care – PPO | Admitting: Family Medicine

## 2022-11-01 ENCOUNTER — Encounter: Payer: Self-pay | Admitting: Family Medicine

## 2022-11-01 VITALS — BP 114/60 | HR 104 | Wt 193.0 lb

## 2022-11-01 DIAGNOSIS — Z3A29 29 weeks gestation of pregnancy: Secondary | ICD-10-CM

## 2022-11-01 DIAGNOSIS — O24410 Gestational diabetes mellitus in pregnancy, diet controlled: Secondary | ICD-10-CM

## 2022-11-01 DIAGNOSIS — Z23 Encounter for immunization: Secondary | ICD-10-CM

## 2022-11-01 DIAGNOSIS — Z3403 Encounter for supervision of normal first pregnancy, third trimester: Secondary | ICD-10-CM | POA: Diagnosis not present

## 2022-11-01 NOTE — Patient Instructions (Signed)
  [facility] [email or web address]  Please continue taking your aspirin  Think about what you would like for birth control.   Keep checking your sugars in the morning and 2 hours after eating.

## 2022-11-01 NOTE — Progress Notes (Unsigned)
  [facility] Prenatal Visit  Julie Michael is a 27 y.o. G1P0 at [redacted]w[redacted]d here for routine follow up. She is dated by LMP.  She reports no complaints. Says she does have back pain and some shortness of breath with longer walking. She reports fetal movement. She denies vaginal bleeding, contractions, or loss of fluid. See flow sheet for details.  Vitals:   11/01/22 1558  BP: 114/60  Pulse: (!) 104      A/P: Pregnancy at [redacted]w[redacted]d.  Doing well.   Routine prenatal care:  Dating reviewed, dating tab is correct Fetal heart tones {appropriate:23337} Fundal height {fundal height:23342::"within expected range. "} Infant feeding choice: Breastfeeding Contraception choice: Undecided  Infant circumcision desired not applicable  The patient does not have a history of Cesarean delivery and no referral to [facility] is indicated Influenza vaccine not administered as not influenza season.   Tdap was given today. 1 hour glucola, CBC, RPR, and HIV were obtained today.    Rh status was reviewed and patient does not need Rhogam.  Rhogam was not given today.  Pregnancy medical home and PHQ-9 forms were done today and reviewed.   Childbirth and education classes were offered. Pregnancy education regarding benefits of breastfeeding, contraception, fetal growth, expected weight gain, and safe infant sleep were discussed.  Preterm labor and fetal movement precautions reviewed.  2. Pregnancy issues include the following and were addressed as appropriate today:   Gestational Diabetes -   Nausea and vomiting - Resolved   Problem list and pregnancy box updated: {yes/no:20286::"Yes"}.   Patient scheduled in [facility] during third trimester on ***.  Follow up 2 weeks.

## 2022-11-02 DIAGNOSIS — O24419 Gestational diabetes mellitus in pregnancy, unspecified control: Secondary | ICD-10-CM | POA: Insufficient documentation

## 2022-11-02 DIAGNOSIS — Z8632 Personal history of gestational diabetes: Secondary | ICD-10-CM | POA: Insufficient documentation

## 2022-11-02 LAB — CBC WITH DIFFERENTIAL/PLATELET
Basophils Absolute: 0 10*3/uL (ref 0.0–0.2)
Basos: 0 %
EOS (ABSOLUTE): 0.1 10*3/uL (ref 0.0–0.4)
Eos: 1 %
Hematocrit: 36.8 % (ref 34.0–46.6)
Hemoglobin: 11.8 g/dL (ref 11.1–15.9)
Immature Grans (Abs): 0.1 10*3/uL (ref 0.0–0.1)
Immature Granulocytes: 1 %
Lymphocytes Absolute: 1.3 10*3/uL (ref 0.7–3.1)
Lymphs: 14 %
MCH: 27.6 pg (ref 26.6–33.0)
MCHC: 32.1 g/dL (ref 31.5–35.7)
MCV: 86 fL (ref 79–97)
Monocytes Absolute: 0.6 10*3/uL (ref 0.1–0.9)
Monocytes: 6 %
Neutrophils Absolute: 7.4 10*3/uL — ABNORMAL HIGH (ref 1.4–7.0)
Neutrophils: 78 %
Platelets: 288 10*3/uL (ref 150–450)
RBC: 4.28 x10E6/uL (ref 3.77–5.28)
RDW: 12.3 % (ref 11.7–15.4)
WBC: 9.4 10*3/uL (ref 3.4–10.8)

## 2022-11-02 LAB — HIV ANTIBODY (ROUTINE TESTING W REFLEX): HIV Screen 4th Generation wRfx: NONREACTIVE

## 2022-11-02 LAB — RPR: RPR Ser Ql: NONREACTIVE

## 2022-11-08 ENCOUNTER — Other Ambulatory Visit (HOSPITAL_COMMUNITY): Payer: Self-pay

## 2022-11-08 ENCOUNTER — Inpatient Hospital Stay (HOSPITAL_COMMUNITY)
Admission: AD | Admit: 2022-11-08 | Discharge: 2022-11-08 | Disposition: A | Discharge status: Home / Self Care | Payer: BC Managed Care – PPO | Attending: Obstetrics & Gynecology | Admitting: Obstetrics & Gynecology

## 2022-11-08 ENCOUNTER — Encounter (HOSPITAL_COMMUNITY): Payer: Self-pay | Admitting: Obstetrics & Gynecology

## 2022-11-08 ENCOUNTER — Telehealth: Payer: Self-pay

## 2022-11-08 ENCOUNTER — Encounter: Payer: Self-pay | Admitting: Family Medicine

## 2022-11-08 DIAGNOSIS — O26893 Other specified pregnancy related conditions, third trimester: Secondary | ICD-10-CM | POA: Diagnosis not present

## 2022-11-08 DIAGNOSIS — Z3A30 30 weeks gestation of pregnancy: Secondary | ICD-10-CM | POA: Diagnosis not present

## 2022-11-08 DIAGNOSIS — O368190 Decreased fetal movements, unspecified trimester, not applicable or unspecified: Secondary | ICD-10-CM | POA: Insufficient documentation

## 2022-11-08 DIAGNOSIS — Z3689 Encounter for other specified antenatal screening: Secondary | ICD-10-CM

## 2022-11-08 DIAGNOSIS — B3732 Chronic candidiasis of vulva and vagina: Secondary | ICD-10-CM | POA: Insufficient documentation

## 2022-11-08 DIAGNOSIS — N939 Abnormal uterine and vaginal bleeding, unspecified: Secondary | ICD-10-CM

## 2022-11-08 LAB — WET PREP, GENITAL
Clue Cells Wet Prep HPF POC: NONE SEEN
Sperm: NONE SEEN
Trich, Wet Prep: NONE SEEN
WBC, Wet Prep HPF POC: <10 (ref <10)
Yeast Wet Prep HPF POC: NONE SEEN

## 2022-11-08 MED ORDER — FLUCONAZOLE 150 MG PO TABS
150.0000 mg | ORAL_TABLET | Freq: Every day | ORAL | 0 refills | Status: DC
  Filled 2022-11-08: qty 2, 2d supply, fill #0

## 2022-11-08 NOTE — Discharge Instructions (Signed)
Dear Julie Michael,  We saw you for an episode of pink dental discharge. Your lab work came back negative for yeast or BV.  However, due to your recurrent infections we have sent Diflucan.  Please take that as instructed on the bottle. If you have increased episodes of bleeding, decreased fetal movement, loss of fluids, or any concerns please return to the MAU.   It was a pleasure taking care of you!

## 2022-11-08 NOTE — MAU Note (Signed)
.  Julie Michael is a 27 y.o. at [redacted]w[redacted]d here in MAU reporting: DFM and light pink vaginal spotting. She reports she has not felt any fetal movement since 0730 but has not eaten yet today. She reports when she used the restroom around 1030 she noted light pink vaginal spotting on her tissue. Denies recent IC. Denies vaginal itching and vaginal odors.  Onset of complaint:  Pain score: Denies pain.   FHT: 134 initial external Lab orders placed from triage:  UA

## 2022-11-08 NOTE — Telephone Encounter (Signed)
Patient calls nurse line reporting vaginal bleeding.   She reports she is [redacted] weeks along. She reports she went to the bathroom and when she wiped she noticed a "decent" amount of blood. She reports abdominal cramping. She reports positive fetal movement earlier this morning.   She reports her blood sugar is 115.  She reports feeling anxious and requests an apt for evaluation. Unfortunately, we do not have any apts left for today.   Patient advised to go to MAU for evaluation given the onset of abdominal pain and vaginal bleeding.   Patient agreed with plan.

## 2022-11-08 NOTE — MAU Provider Note (Addendum)
MAU Provider Note   History    CSN: [id number]  Arrival date and time: 11/08/22 1228   Event Date/Time   First Provider Initiated Contact with Patient 11/08/22 1308     Chief Complaint  Patient presents with   Vaginal Bleeding   Decreased Fetal Movement   Julie Michael is a 27 year old G1P0 complicated by GDMA1 here for an episode of pink vaginal bleeding this morning.  Patient reports that about 10:30 AM this morning she went to the bathroom and when she went to wipe she noticed light pink fluid on the tissue but no clots or frank red blood. She reports that it was all over the tissue but that it did not saturate through and she did not note any more in the toilet. She reports that this is not happened prior. Patient denies any recent intercourse.  Patient reports + fetal movement, - LOF, -contractions. Patient denies abdominal pain, dizziness.  Patient reports history of recurrent BV and vaginal candidiasis, being treated by terconazole cream per PCP. Patient denies any current vaginal discharge or discomfort. Patient also has history of HSV but denies any recent lesions.   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   Past Medical History:  Diagnosis Date   Amenorrhea 04/13/2016   BV (bacterial vaginosis) 12/21/2018   Exposure to COVID-19 virus 09/11/2018   Genital lesion, female 12/21/2018   Healthcare maintenance 07/28/2017   Herpes    Morning sickness 06/16/2022   Routine screening for STI (sexually transmitted infection) 09/08/2016   Sinusitis, chronic 08/09/2013   Strain of abdominal muscle 03/30/2018   Vagina, candidiasis 12/21/2018   Vaginal discharge 12/05/2011   VISUAL ACUITY, DECREASED, LEFT EYE 05/26/2009   Qualifier: Diagnosis of   By: Lorenda Hatchet CMA,, [place]       Yeast infection    Yeast infection 03/30/2018    Past Surgical History:  Procedure Laterality Date   WISDOM TOOTH  EXTRACTION      Family History  Problem Relation Age of Onset   Diabetes Other    Hypertension Other    Asthma Neg Hx    Cancer Neg Hx    Heart disease Neg Hx     Social History   Tobacco Use   Smoking status: Never    Passive exposure: Never   Smokeless tobacco: Never  Vaping Use   Vaping status: Never Used  Substance Use Topics   Alcohol use: Not Currently    Comment: occas   Drug use: No    Allergies: No Known Allergies  Medications Prior to Admission  Medication Sig Dispense Refill Last Dose   aspirin EC 81 MG tablet Take 1 tablet (81 mg total) by mouth daily. Swallow whole. 30 tablet 12 11/08/2022   Prenatal Vit-Fe Fumarate-FA (MULTIVITAMIN-PRENATAL) 27-0.8 MG TABS tablet Take 1 tablet by mouth daily at 12 noon.   11/08/2022   blood glucose meter kit and supplies KIT Dispense based on patient and insurance preference. Check blood sugar fasting and 2 hours after a meal. 1 each 0    Doxylamine-Pyridoxine 10-10 MG TBEC Take 2 tabs at bedtime. If needed, add another tab in the morning. If needed, add another tab in the afternoon, up to 4 tabs/day. 120 tablet 2    terconazole (TERAZOL 7) 0.4 % vaginal cream Place 1 applicator vaginally at bedtime. 45 g 0    valACYclovir (VALTREX) 500 MG tablet Take 1 tablet (500 mg total) by mouth daily. (  Patient not taking: Reported on 08/23/2022) 90 tablet 2     Physical Exam   Blood pressure 110/61, pulse 97, temperature 98.7 F (37.1 C), temperature source Oral, resp. rate 16, height 5' (1.524 m), weight 87.3 kg, last menstrual period 04/11/2022, SpO2 100%.  Physical Exam Exam conducted with a chaperone present.  Constitutional:      General: She is not in acute distress.    Appearance: Normal appearance.  Cardiovascular:     Rate and Rhythm: Normal rate and regular rhythm.  Pulmonary:     Effort: Pulmonary effort is normal.     Breath sounds: Normal breath sounds.  Abdominal:     Tenderness: There is no abdominal tenderness.   Genitourinary:    General: Normal vulva.     Vagina: Vaginal discharge present.     Cervix: Friability present.        Comments: White, thin discharge noted along vaginal canal and at perineum.   Lesion at 11 o clock, 32mmx5mm ovular. Bleeding with gentle irritation from swab.   Musculoskeletal:        General: No swelling.     Right lower leg: No edema.     Left lower leg: No edema.  Skin:    General: Skin is warm and dry.  Neurological:     Mental Status: She is oriented to person, place, and time.  Psychiatric:        Mood and Affect: Mood normal.        Behavior: Behavior normal.    Fetal Heart Tracing: Baseline: 145 bpm Reactivity: moderate  Accelerations: present, 15x15 No decels    Toco: no contractions graphed   MAU Course   MDM Patient with one episode of pink discharge seen on toilet paper this AM. No recent intercourse. Has history of recurrent BV and vaginal candidiasis. Differential includes cervical sensitivity secondary to movement or yeast infection, less likely early labor (no signs of labor, no ctx noted on toc), placenta previa, placental abruption. On speculum exam, thin white discharge noted, no obvious smell. Cervix is closed, some pinpoint bleeding when inspected with swab, suspicious for cervical sensitivity. As patient has no signs of labor with no contractions noted on toc, early labor unlikely. Most recent US reviewed did not show previa, making this less likely. Patient has no abdominal pain or tenderness, making abruption less likely.   Of note, patient had small 6mmx5mm ovular lesion noted at 11 oclock on cervix.    Assessment and Plan    Pink vaginal discharge - ordered GC & wet prep - white discharge concerning for vulvovaginal candidiasis , will treat with diflucan 150 mg for 2 doses  - reactive FHT, no decels noted, no contractions   Cervical abnormality - recommend patient follow up with PCP and schedule pap smear with HPV testing  after delivery   Hal Morales, MD Family Medicine PGY-1 2:03 pm  Reassessment:   Wet prep negative for clue cells or yeast. As patient has history of recurrent infection, will still treat with diflucan.  Patient counseled on return precautions. Patient stable at discharge.   Hal Morales, MD 11/08/2022, 2:42 PM

## 2022-11-09 LAB — GC/CHLAMYDIA PROBE AMP (~~LOC~~) NOT AT ARMC
Chlamydia: NEGATIVE
Comment: NEGATIVE
Comment: NORMAL
Neisseria Gonorrhea: NEGATIVE

## 2022-11-10 ENCOUNTER — Other Ambulatory Visit: Payer: Self-pay

## 2022-11-10 ENCOUNTER — Ambulatory Visit: Payer: BC Managed Care – PPO | Admitting: Family Medicine

## 2022-11-10 VITALS — BP 124/78 | HR 95 | Wt 192.6 lb

## 2022-11-10 DIAGNOSIS — Z3403 Encounter for supervision of normal first pregnancy, third trimester: Secondary | ICD-10-CM

## 2022-11-10 DIAGNOSIS — B009 Herpesviral infection, unspecified: Secondary | ICD-10-CM

## 2022-11-10 DIAGNOSIS — Z3A30 30 weeks gestation of pregnancy: Secondary | ICD-10-CM

## 2022-11-10 MED ORDER — VALACYCLOVIR HCL 500 MG PO TABS
500.0000 mg | ORAL_TABLET | Freq: Two times a day (BID) | ORAL | 0 refills | Status: AC
Start: 2022-11-10 — End: 2022-12-10

## 2022-11-10 NOTE — Progress Notes (Signed)
  [facility] Prenatal Visit  Julie Michael is a 27 y.o. G1P0 at [redacted]w[redacted]d here for routine follow up. She is dated by LMP.  She reports  no more  vaginal bleeding since being seen in the MAU 8/27 . She reports fetal movement. She denies vaginal bleeding, contractions, or loss of fluid. See flow sheet for details.  Vitals:   11/10/22 1029  BP: 124/78  Pulse: 95    A/P: Pregnancy at [redacted]w[redacted]d.  Doing well.   Routine prenatal care:  Dating reviewed, dating tab is correct Fetal heart tones Appropriate Fundal height >2 cm from expected size given dating, discussed with preceptor. Patient has dgx of GDMA1, with FBS <90 and post prandial <110. Patient was given referral to OB at last visit, but has not received a call. Gave patient phone number for femina to call and f/u on appointment.  Infant feeding choice: Breastfeeding Contraception choice: Undecided  Infant circumcision desired not applicable The patient does not have a history of Cesarean delivery and no referral to [facility] is indicated Influenza vaccine not administered as patient declined, will continue to discuss.  Highly encouraged patient to consider flu vaccine as patients due date is in flu season. Patient states she will think about it.  Tdap was not given today.  Rh status was reviewed and patient does not need Rhogam.  Rhogam was not given today.  Pregnancy medical home and PHQ-9 forms were done today and reviewed.   Childbirth and education classes were offered. Patient is interested  Pregnancy education regarding benefits of breastfeeding, contraception, fetal growth, expected weight gain, and safe infant sleep were discussed.  Preterm labor and fetal movement precautions reviewed.  2. Pregnancy issues include the following and were addressed as appropriate today:  Hx of HSV Patient not taking Valtrex and was only taking prn for outbreaks. She reports she has not had any outbreaks since  03/2022. Discussed patient needs to take Valtrex BID from 36 weeks onward. Patient expressed understanding. Will send prescription.  GMDA1 Patients blood sugars have been measuring appropriately. No medication management indicated at this time.  Episode of vaginal bleeding Patient seen at MAU 8/27 for episode of vaginal bleeding. Per MAU note, likely 2/2 to cervical sensitivity.Patient has history of BV and yeast infections during this pregnancy. Patient prescribed diflucan from MAU, but is not taking d/t concern of fetal wellbeing. Patient denying symptoms at this time. Counseled on risks/benefits of diflucan, patient expressed understanding. Patient denies any episodes of bleeding since. Patient did have area of concern on cervix, discussed pp pap smear with HPV testing.  Follow up 1 week   Fundal height > 2cm from expected size given dating Patient was measuring >2cm from ES during prior visit and had U/S done 8/13 which showed EFW of  28%. Patient will schedule f/u with OB. Continue to follow.      Problem list and pregnancy box updated: Yes.

## 2022-11-10 NOTE — Patient Instructions (Signed)
For info on prenatal classes, please go to [email or web address]  Go to the MAU at [facility] at [facility] if: You have cramping/contractions that do not go away with drinking water Your water breaks.  Sometimes it is a big gush of fluid, sometimes it is just a trickle that keeps getting your underwear wet or running down your legs You have vaginal bleeding.    You do not feel your baby moving like normal.  If you do not, get something to eat and drink and lay down and focus on feeling your baby move. If your baby is still not moving like normal, you should go to MAU.   Third Trimester of Pregnancy  The third trimester of pregnancy is from week 28 through week 40. This is months 7 through 9. The third trimester is a time when the unborn baby (fetus) is growing rapidly. At the end of the ninth month, the fetus is about 20 inches long and weighs 6-10 pounds. Body changes during your third trimester During the third trimester, your body will continue to go through many changes. The changes vary and generally return to normal after your baby is born. Physical changes Your weight will continue to increase. You can expect to gain 25-35 pounds (11-16 kg) by the end of the pregnancy if you begin pregnancy at a normal weight. If you are underweight, you can expect to gain 28-40 lb (about 13-18 kg), and if you are overweight, you can expect to gain 15-25 lb (about 7-11 kg). You may begin to get stretch marks on your hips, abdomen, and breasts. Your breasts will continue to grow and may hurt. A yellow fluid (colostrum) may leak from your breasts. This is the first milk you are producing for your baby. You may have changes in your hair. These can include thickening of your hair, rapid growth, and changes in texture. Some people also have hair loss during or after pregnancy, or hair that feels dry or thin. Your belly button may stick out. You may notice more swelling in your hands, face, or  ankles. Health changes You may have heartburn. You may have constipation. You may develop hemorrhoids. You may develop swollen, bulging veins (varicose veins) in your legs. You may have increased body aches in the pelvis, back, or thighs. This is due to weight gain and increased hormones that are relaxing your joints. You may have increased tingling or numbness in your hands, arms, and legs. The skin on your abdomen may also feel numb. You may feel short of breath because of your expanding uterus. Other changes You may urinate more often because the fetus is moving lower into your pelvis and pressing on your bladder. You may have more problems sleeping. This may be caused by the size of your abdomen, an increased need to urinate, and an increase in your body's metabolism. You may notice the fetus "dropping," or moving lower in your abdomen (lightening). You may have increased vaginal discharge. You may notice that you have pain around your pelvic bone as your uterus distends. Follow these instructions at home: Medicines Follow your health care provider's instructions regarding medicine use. Specific medicines may be either safe or unsafe to take during pregnancy. Do not take any medicines unless approved by your health care provider. Take a prenatal vitamin that contains at least 600 micrograms (mcg) of folic acid. Eating and drinking Eat a healthy diet that includes fresh fruits and vegetables, whole grains, good sources of protein such  as meat, eggs, or tofu, and low-fat dairy products. Avoid raw meat and unpasteurized juice, milk, and cheese. These carry germs that can harm you and your baby. Eat 4 or 5 small meals rather than 3 large meals a day. You may need to take these actions to prevent or treat constipation: Drink enough fluid to keep your urine pale yellow. Eat foods that are high in fiber, such as beans, whole grains, and fresh fruits and vegetables. Limit foods that are high in  fat and processed sugars, such as fried or sweet foods. Activity Exercise only as directed by your health care provider. Most people can continue their usual exercise routine during pregnancy. Try to exercise for 30 minutes at least 5 days a week. Stop exercising if you experience contractions in the uterus. Stop exercising if you develop pain or cramping in the lower abdomen or lower back. Avoid heavy lifting. Do not exercise if it is very hot or humid or if you are at a high altitude. If you choose to, you may continue to have sex unless your health care provider tells you not to. Relieving pain and discomfort Take frequent breaks and rest with your legs raised (elevated) if you have leg cramps or low back pain. Take warm sitz baths to soothe any pain or discomfort caused by hemorrhoids. Use hemorrhoid cream if your health care provider approves. Wear a supportive bra to prevent discomfort from breast tenderness. If you develop varicose veins: Wear support hose as told by your health care provider. Elevate your feet for 15 minutes, 3-4 times a day. Limit salt in your diet. Safety Talk to your health care provider before traveling far distances. Do not use hot tubs, steam rooms, or saunas. Wear your seat belt at all times when driving or riding in a car. Talk with your health care provider if someone is verbally or physically abusive to you. Preparing for birth To prepare for the arrival of your baby: Take prenatal classes to understand, practice, and ask questions about labor and delivery. Visit the hospital and tour the maternity area. Purchase a rear-facing car seat and make sure you know how to install it in your car. Prepare the baby's room or sleeping area. Make sure to remove all pillows and stuffed animals from the baby's crib to prevent suffocation. General instructions Avoid cat litter boxes and soil used by cats. These carry germs that can cause birth defects in the baby. If  you have a cat, ask someone to clean the litter box for you. Do not douche or use tampons. Do not use scented sanitary pads. Do not use any products that contain nicotine or tobacco, such as cigarettes, e-cigarettes, and chewing tobacco. If you need help quitting, ask your health care provider. Do not use any herbal remedies, illegal drugs, or medicines that were not prescribed to you. Chemicals in these products can harm your baby. Do not drink alcohol. You will have more frequent prenatal exams during the third trimester. During a routine prenatal visit, your health care provider will do a physical exam, perform tests, and discuss your overall health. Keep all follow-up visits. This is important. Where to find more information American Pregnancy Association: americanpregnancy.org [employer]: https://www.todd-brady.net/ Office on [employer] Health: MightyReward.co.nz Contact a health care provider if you have: A fever. Mild pelvic cramps, pelvic pressure, or nagging pain in your abdominal area or lower back. Vomiting or diarrhea. Bad-smelling vaginal discharge or foul-smelling urine. Pain when you urinate.  A headache that does not go away when you take medicine. Visual changes or see spots in front of your eyes. Get help right away if: Your water breaks. You have regular contractions less than 5 minutes apart. You have spotting or bleeding from your vagina. You have severe abdominal pain. You have difficulty breathing. You have chest pain. You have fainting spells. You have not felt your baby move for the time period told by your health care provider. You have new or increased pain, swelling, or redness in an arm or leg. Summary The third trimester of pregnancy is from week 28 through week 40 (months 7 through 9). You may have more problems sleeping. This can be caused by the size of your abdomen, an increased need to urinate, and  an increase in your body's metabolism. You will have more frequent prenatal exams during the third trimester. Keep all follow-up visits. This is important. This information is not intended to replace advice given to you by your health care provider. Make sure you discuss any questions you have with your health care provider. Document Revised: 08/07/2019 Document Reviewed: 06/13/2019 Elsevier Patient Education  2024 [employer].

## 2022-11-10 NOTE — Progress Notes (Deleted)
  [facility] Prenatal Visit  Julie Michael is a 27 y.o. G1P0 at [redacted]w[redacted]d here for routine follow up. She is dated by LMP.  She reports no nausea or vomiting, no bleeding, no contractions, no cramping, and no leaking. She reports fetal movement. She denies vaginal bleeding, contractions, or loss of fluid. See flow sheet for details.  Seen at the MAU on 8/27 for pink vaginal bleeding. Speculum exam revealed friability present in cervix and thin white discharge. Patient prescribed diflucan 150 mg x 2 doses. Patient reports she has not taken the diflucan; she wanted to confirm it was safe to take with pregnancy. Patient was recommended to f/u with PCP and schedule pap smear with HPV testing after delivery.  Vitals:   11/10/22 1029  BP: 124/78  Pulse: 95   A/P: Pregnancy at [redacted]w[redacted]d.  Doing well.   Routine prenatal care:  Dating reviewed, dating tab is correct Fetal heart tones Appropriate Fundal height >2 cm from expected size given dating, discussed with preceptor.  Infant feeding choice: Both  Contraception choice: Undecided  Infant circumcision not applicable  The patient does not have a history of Cesarean delivery and no referral to [facility] is indicated Influenza vaccine not administered as patient declined, will continue to discuss.   Tdap was not given today. 1 hour glucola, CBC, RPR, and HIV were not obtained today.    Rh status was reviewed and patient does not need Rhogam.  Rhogam was not given today.  Pregnancy medical home and PHQ-9 forms {WERE / WERE NOT:19253} done today and reviewed.   Childbirth and education classes were offered. Pregnancy education regarding benefits of breastfeeding, contraception, fetal growth, expected weight gain, and safe infant sleep were discussed.  Preterm labor and fetal movement precautions reviewed.  2. Pregnancy issues include the following and were addressed as appropriate today:  Hx of HSV Patient reports  last HSV outbreak in January of 2024. She endorses having a prescription for valacyclovir but has not been taking it since she has not had an outbreak. Patient advised to start Valacyclovir 500 mg BID starting at 36 weeks. Prescription sent to pharmacy.  GDMA1 Patient reports average blood glucose readings in 80s fasting and in the 90s 2 hours after meals. She reports consistently checking her blood glucose levels. Well controlled through diet.   Fundal Height Discrepancy Measured at 35 today although she is 30 weeks pregnancy. Last [place] on 10/25/22; Est. FW: 1141 gm 2 lb 8 oz 28 %. She is scheduled for an upcoming ultrasound with MFM on 9/24. Patient advised to follow up with OB. Phone number for [facility] provided to patient.   MAU F/U No vaginal discharge reported today. Vulvovaginal candidiasis appears well controlled despite no medication use.   Problem list and pregnancy box updated: Yes.   Patient scheduled in [facility] during third trimester on ***.  Follow up 2 weeks.

## 2022-11-18 ENCOUNTER — Ambulatory Visit (INDEPENDENT_AMBULATORY_CARE_PROVIDER_SITE_OTHER): Payer: BC Managed Care – PPO | Admitting: Student

## 2022-11-18 ENCOUNTER — Ambulatory Visit: Payer: Self-pay | Admitting: Family Medicine

## 2022-11-18 VITALS — BP 107/73 | HR 90 | Ht 60.0 in | Wt 193.6 lb

## 2022-11-18 DIAGNOSIS — Z3403 Encounter for supervision of normal first pregnancy, third trimester: Secondary | ICD-10-CM

## 2022-11-18 NOTE — Patient Instructions (Signed)
It was great to see you today! Thank you for choosing [facility] for your primary care.  Today we addressed: No changes to your care at this time.  I am glad you have a follow-up with OB.  Please keep your current appointment with Dr. Ardyth Harps on 9/10.  You should probably cancel your appointment with Dr. Marsh Dolly in the future since he will be seeing OB. Please get your flu vaccine at the next visit.  If you haven't already, sign up for My Chart to have easy access to your labs results, and communication with your primary care physician.  Please arrive 15 minutes before your appointment to ensure smooth check in process.  We appreciate your efforts in making this happen.  Thank you for allowing me to participate in your care, Julie Mattocks, DO 11/18/2022, 11:20 AM PGY-3, [facility]

## 2022-11-18 NOTE — Progress Notes (Signed)
  [facility] Prenatal Visit  Nicolette Suite is a 27 y.o. G1P0 at [redacted]w[redacted]d here for routine follow up. She is dated by LMP.  She reports no complaints.  She reports fetal movement. She denies vaginal bleeding, contractions, or loss of fluid.  See flow sheet for details.  Vitals:   11/18/22 1053  BP: 107/73  Pulse: 90  SpO2: 100%  Gen: well-appearing, NAD CV: RRR, no murmurs auscultated Pulm: CTAB, normal WOB FHR: 140 Uterine size: 34cm  A/P: Pregnancy at [redacted]w[redacted]d.  Doing well.   Routine prenatal care:  Dating reviewed, dating tab is correct Fetal heart tones: Appropriate Fundal height: >2 cm from expected size given dating, noted during prior visit.  The patient does have a history of HSV and valacyclovir is not indicated at this time. Discussed prior to start taking Valtrex BID starting at 36 weeks.  The patient does not have a history of Cesarean delivery and no referral to [facility] is indicated Infant feeding choice: not discussed Contraception choice: not discussed. Infant circumcision desired not applicable Influenza vaccine not administered as patient declined, will continue to discuss.  She is amenable to getting this next visit as we thoroughly discussed this today. Tdap was not given today. Childbirth and education classes were not offered. Pregnancy education regarding benefits of breastfeeding, contraception, fetal growth, expected weight gain, and safe infant sleep were discussed.  Preterm labor and fetal movement precautions reviewed.  2. Pregnancy issues include the following and were addressed as appropriate today:  A1GDM/size>dates: fasting 85, 2hr postprandial 90s. Has growth u/s scheduled on 9/24. Has appointment with OB on 9/17. Follow up sugars at next visit 9/10.   History of genital HSV: already has RX for valtrex 500mg  BID to begin at 36w  Flu vaccine: amenable to receiving this at next visit.  Problem list and pregnancy box  updated: Yes.   Follow up as noted above.  Shelby Mattocks, DO

## 2022-11-21 NOTE — Progress Notes (Unsigned)
  [facility] Prenatal Visit  Julie Michael is a 27 y.o. G1P0 at [redacted]w[redacted]d here for routine follow up. She is dated by {Ob dating:14516}.  She reports {symptoms; pregnancy related:14538}.  She reports fetal movement. She denies vaginal bleeding, contractions, or loss of fluid.  See flow sheet for details.  There were no vitals filed for this visit.  *Flu shoy  *Check BGL  A/P: Pregnancy at [redacted]w[redacted]d.  Doing well.   Routine prenatal care:  Dating reviewed, dating tab is {correct:23336::"correct"} Fetal heart tones: {appropriate:23337} Fundal height: {fundal height:23342::"within expected range. "} The patient {does/does not:200015::"does not"} have a history of HSV and valacyclovir {ACTION; IS/IS NOT:21021397::"is not"} indicated at this time.  The patient {CWHCS:23409::"does not have a history of Cesarean delivery and no referral to [facility] for [facility] is indicated"} Infant feeding choice: {Breasfeeding:23347::"Breastfeeding"} Contraception choice: {postpartum contraception:23348} Infant circumcision desired {Response; yes/no/na:63} Influenza vaccine {given:23340}  Tdap {was/was not:19854::"was"} given today. COVID vaccination was discussed and ***.  Childbirth and education classes {WERE / WERE UJW:11914} offered. Pregnancy education regarding benefits of breastfeeding, contraception, fetal growth, expected weight gain, and safe infant sleep were discussed.  Preterm labor and fetal movement precautions reviewed.   2. Pregnancy issues include the following and were addressed as appropriate today:  ***  Problem list and pregnancy box updated: {yes/no:20286::"Yes"}.   Scheduled for Ob Faculty clinic in third trimester on ***.   Follow up 2 weeks.

## 2022-11-22 ENCOUNTER — Other Ambulatory Visit: Payer: Self-pay

## 2022-11-22 ENCOUNTER — Ambulatory Visit (INDEPENDENT_AMBULATORY_CARE_PROVIDER_SITE_OTHER): Payer: BC Managed Care – PPO | Admitting: Family Medicine

## 2022-11-22 VITALS — BP 112/69 | HR 96 | Wt 194.4 lb

## 2022-11-22 DIAGNOSIS — Z3403 Encounter for supervision of normal first pregnancy, third trimester: Secondary | ICD-10-CM

## 2022-11-22 DIAGNOSIS — Z23 Encounter for immunization: Secondary | ICD-10-CM

## 2022-11-22 DIAGNOSIS — O24410 Gestational diabetes mellitus in pregnancy, diet controlled: Secondary | ICD-10-CM

## 2022-11-22 DIAGNOSIS — Z3A32 32 weeks gestation of pregnancy: Secondary | ICD-10-CM

## 2022-11-22 NOTE — Patient Instructions (Addendum)
Please keep your [facility] with [facility].    Please continue to check your blood sugars daily fasting 2 hours after you eat and keep a log.  Prenatal Classes Go to [email or web address] for more information on the pregnancy and child birth classes that [place] has to offer.   Pregnancy Related Return Precautions The follow are signs/symptoms that are abnormal in pregnancy and may require further evaluation by a physician: Go to the MAU at [facility] if: You have cramping/contractions that do not go away with drinking water, especially if they are lasting 30 seconds to 1.5 minutes, coming and going every 5-10 minutes for an hour or more, or are getting stronger and you cannot walk or talk while having a contraction/cramp. Your water breaks.  Sometimes it is a big gush of fluid, sometimes it is just a trickle that keeps getting your underwear wet or running down your legs You have vaginal bleeding.    You do not feel your baby moving like normal.  If you do not, get something to eat and drink (something cold or something with sugar like peanut butter or juice) and lay down and focus on feeling your baby move. If your baby is still not moving like normal, you should go to MAU. You should feel your baby move 6 times in one hour, or 10 times in two hours. You have a persistent headache that does not go away with 1 g of Tylenol, vision changes, chest pain, difficulty breathing, severe pain in your right upper abdomen, worsening leg swelling- these can all be signs of high blood pressure in pregnancy and need to be evaluated by a provider immediately  These are all concerning in pregnancy and if you have any of these I recommend you call your PCP and present to the Maternity Admissions Unit (map below) for further evaluation.  For any pregnancy-related emergencies, please go to the Maternity Admissions Unit in the  [facility] at [facility]. You will use hospital Entrance C.    Our clinic number is [phone].

## 2022-11-25 DIAGNOSIS — M5489 Other dorsalgia: Secondary | ICD-10-CM | POA: Diagnosis not present

## 2022-11-29 ENCOUNTER — Ambulatory Visit (INDEPENDENT_AMBULATORY_CARE_PROVIDER_SITE_OTHER): Payer: BC Managed Care – PPO | Admitting: Obstetrics and Gynecology

## 2022-11-29 VITALS — BP 104/71 | HR 101 | Wt 196.0 lb

## 2022-11-29 DIAGNOSIS — O24410 Gestational diabetes mellitus in pregnancy, diet controlled: Secondary | ICD-10-CM

## 2022-11-29 DIAGNOSIS — B009 Herpesviral infection, unspecified: Secondary | ICD-10-CM

## 2022-11-29 DIAGNOSIS — Z3A33 33 weeks gestation of pregnancy: Secondary | ICD-10-CM

## 2022-11-29 DIAGNOSIS — Z3403 Encounter for supervision of normal first pregnancy, third trimester: Secondary | ICD-10-CM

## 2022-11-29 NOTE — Progress Notes (Signed)
   PRENATAL VISIT NOTE  Subjective:  Julie Michael is a 27 y.o. G1P0 at [redacted]w[redacted]d being seen today for ongoing prenatal care.  She is currently monitored for the following issues for this high-risk pregnancy and has Impacted cerumen of right ear; Genital warts; Encounter for supervision of normal first pregnancy in third trimester; HSV infection; and Gestational diabetes on their problem list.  Patient doing well with no acute concerns today. She reports no complaints.  Contractions: Not present. Vag. Bleeding: None.  Movement: Present. Denies leaking of fluid.   The following portions of the patient's history were reviewed and updated as appropriate: allergies, current medications, past family history, past medical history, past social history, past surgical history and problem list. Problem list updated.  Objective:   Vitals:   11/29/22 0909  BP: 104/71  Pulse: (!) 101  Weight: 196 lb (88.9 kg)    Fetal Status: Fetal Heart Rate (bpm): 140 Fundal Height: 33 cm Movement: Present     General:  Alert, oriented and cooperative. Patient is in no acute distress.  Skin: Skin is warm and dry. No rash noted.   Cardiovascular: Normal heart rate noted  Respiratory: Normal respiratory effort, no problems with respiration noted  Abdomen: Soft, gravid, appropriate for gestational age.  Pain/Pressure: Absent     Pelvic: Cervical exam deferred        Extremities: Normal range of motion.     Mental Status:  Normal mood and affect. Normal behavior. Normal judgment and thought content.   Assessment and Plan:  Pregnancy: G1P0 at [redacted]w[redacted]d  1. [redacted] weeks gestation of pregnancy   2. Diet controlled gestational diabetes mellitus (GDM) in third trimester Pt never received diabetic teaching for diet modification  FBS: 71-93 PPBS:  112  Advised pt to record blood sugars on monitoring sheet - Referral to Nutrition and Diabetes Services  3. Encounter for supervision of normal first pregnancy in third  trimester Continue routine prenatal care  4. HSV infection Prophylaxis at 36 weeks  Preterm labor symptoms and general obstetric precautions including but not limited to vaginal bleeding, contractions, leaking of fluid and fetal movement were reviewed in detail with the patient.  Please refer to After Visit Summary for other counseling recommendations.   Return in about 2 weeks (around 12/13/2022) for [facility], in person.   Mariel Aloe, MD Faculty Attending Center for [facility]

## 2022-11-29 NOTE — Progress Notes (Signed)
Pt was referred by Family [facility] for GDM. Pt is doing well, has glucose readings with her today.

## 2022-11-30 ENCOUNTER — Ambulatory Visit: Payer: BC Managed Care – PPO

## 2022-12-02 ENCOUNTER — Ambulatory Visit: Payer: Self-pay | Admitting: Family Medicine

## 2022-12-03 ENCOUNTER — Inpatient Hospital Stay (HOSPITAL_COMMUNITY)
Admission: AD | Admit: 2022-12-03 | Discharge: 2022-12-03 | Disposition: A | Discharge status: Home / Self Care | Payer: BC Managed Care – PPO | Attending: Family Medicine | Admitting: Family Medicine

## 2022-12-03 ENCOUNTER — Encounter (HOSPITAL_COMMUNITY): Payer: Self-pay | Admitting: Family Medicine

## 2022-12-03 DIAGNOSIS — R102 Pelvic and perineal pain: Secondary | ICD-10-CM | POA: Diagnosis not present

## 2022-12-03 DIAGNOSIS — Z3A33 33 weeks gestation of pregnancy: Secondary | ICD-10-CM | POA: Insufficient documentation

## 2022-12-03 DIAGNOSIS — Z3403 Encounter for supervision of normal first pregnancy, third trimester: Secondary | ICD-10-CM | POA: Insufficient documentation

## 2022-12-03 DIAGNOSIS — O26893 Other specified pregnancy related conditions, third trimester: Secondary | ICD-10-CM

## 2022-12-03 LAB — URINALYSIS, ROUTINE W REFLEX MICROSCOPIC
Bilirubin Urine: NEGATIVE
Glucose, UA: NEGATIVE mg/dL
Hgb urine dipstick: NEGATIVE
Ketones, ur: 20 mg/dL — AB
Nitrite: NEGATIVE
Protein, ur: NEGATIVE mg/dL
Specific Gravity, Urine: 1.021 (ref 1.005–1.030)
pH: 6 (ref 5.0–8.0)

## 2022-12-03 NOTE — MAU Provider Note (Signed)
Obstetric Attending MAU Note  Chief Complaint:  Contractions and Pelvic Pain   Event Date/Time   First Provider Initiated Contact with Patient 12/03/22 2312     HPI: Julie Michael is a 27 y.o. G1P0 at [redacted]w[redacted]d who presents to maternity admissions reporting pelvic pressure, cramping and feeling like a bowling ball in her pelvis. Discomfort occurred 4x today and lasted about 15 mins. No pain at present. Works 2 jobs. Not much po intake or water today. Denies contractions, leakage of fluid or vaginal bleeding. Good fetal movement.   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   Encounter for supervision of normal first pregnancy in third trimester 05/23/2022   Genital warts 01/23/2015   Impacted cerumen of right ear 07/06/2006    Past Medical History:  Diagnosis Date   Amenorrhea 04/13/2016   BV (bacterial vaginosis) 12/21/2018   Exposure to COVID-19 virus 09/11/2018   Genital lesion, female 12/21/2018   Healthcare maintenance 07/28/2017   Herpes    Morning sickness 06/16/2022   Routine screening for STI (sexually transmitted infection) 09/08/2016   Sinusitis, chronic 08/09/2013   Strain of abdominal muscle 03/30/2018   Vagina, candidiasis 12/21/2018   Vaginal discharge 12/05/2011   VISUAL ACUITY, DECREASED, LEFT EYE 05/26/2009   Qualifier: Diagnosis of   By: Lorenda Hatchet CMA,, Thekla       Yeast infection    Yeast infection 03/30/2018    OB History  Gravida Para Term Preterm AB Living  1            SAB IAB Ectopic Multiple Live Births               # Outcome Date GA Lbr Len/2nd Weight Sex Type Anes PTL Lv  1 Current             Past Surgical History:  Procedure Laterality Date   WISDOM TOOTH EXTRACTION      Family History: Family History  Problem Relation Age of Onset   Diabetes Other    Hypertension Other    Asthma Neg Hx    Cancer Neg Hx    Heart disease  Neg Hx     Social History: Social History   Tobacco Use   Smoking status: Never    Passive exposure: Never   Smokeless tobacco: Never  Vaping Use   Vaping status: Never Used  Substance Use Topics   Alcohol use: Not Currently    Comment: occas   Drug use: No    Allergies: No Known Allergies  Medications Prior to Admission  Medication Sig Dispense Refill Last Dose   aspirin EC 81 MG tablet Take 1 tablet (81 mg total) by mouth daily. Swallow whole. 30 tablet 12 12/02/2022   Prenatal Vit-Fe Fumarate-FA (MULTIVITAMIN-PRENATAL) 27-0.8 MG TABS tablet Take 1 tablet by mouth daily at 12 noon.   12/03/2022   blood glucose meter kit and supplies KIT Dispense based on patient and insurance preference. Check blood sugar fasting and 2 hours after a meal. 1 each 0    valACYclovir (VALTREX) 500 MG tablet Take 1 tablet (500 mg total) by mouth 2 (two) times daily. Please begin taking one tablet twice daily starting at 36 weeks until you deliver. (Patient not taking: Reported on 11/29/2022) 60 tablet 0     ROS: Pertinent findings in history of present illness.  Physical Exam  Blood pressure 114/67, pulse 100, temperature 98.8 F (  37.1 C), resp. rate 18, height 5' (1.524 m), weight 88 kg, last menstrual period 04/11/2022. CONSTITUTIONAL: Well-developed, well-nourished female in no acute distress.  HENT:  Normocephalic, atraumatic, External right and left ear normal. Oropharynx is clear and moist EYES: Conjunctivae and EOM are normal.  No scleral icterus.  NECK: Normal range of motion, supple, no masses SKIN: Skin is warm and dry. No rash noted. Not diaphoretic. No erythema. No pallor. NEUROLGIC: Alert and oriented to person, place, and time.  CARDIOVASCULAR: Normal heart rate noted, regular rhythm RESPIRATORY: Effort and breath sounds normal, no problems with respiration noted ABDOMEN: Soft, nontender, nondistended, gravid appropriate for gestational age MUSCULOSKELETAL: Normal range of motion. No  edema and no tenderness. 2+ distal pulses. SVE: closed/ long/posterior/vtx -1  FHT:  Baseline 130 , moderate variability, accelerations present, no decelerations Contractions: quiet   Labs: Results for orders placed or performed during the hospital encounter of 12/03/22 (from the past 24 hour(s))  Urinalysis, Routine w reflex microscopic -Urine, Clean Catch     Status: Abnormal   Collection Time: 12/03/22 10:37 PM  Result Value Ref Range   Color, Urine YELLOW YELLOW   APPearance HAZY (A) CLEAR   Specific Gravity, Urine 1.021 1.005 - 1.030   pH 6.0 5.0 - 8.0   Glucose, UA NEGATIVE NEGATIVE mg/dL   Hgb urine dipstick NEGATIVE NEGATIVE   Bilirubin Urine NEGATIVE NEGATIVE   Ketones, ur 20 (A) NEGATIVE mg/dL   Protein, ur NEGATIVE NEGATIVE mg/dL   Nitrite NEGATIVE NEGATIVE   Leukocytes,Ua SMALL (A) NEGATIVE   RBC / HPF 0-5 0 - 5 RBC/hpf   WBC, UA 6-10 0 - 5 WBC/hpf   Bacteria, UA FEW (A) NONE SEEN   Squamous Epithelial / HPF 6-10 0 - 5 /HPF   Mucus PRESENT     Imaging:  No results found.  MAU Course: Has Ketones Pelvic exam reveals no evidence of labor but vertex is low in pelvis  Assessment: 1. [redacted] weeks gestation of pregnancy   2. Pelvic pressure in pregnancy, antepartum, third trimester   3. Encounter for supervision of normal first pregnancy in third trimester     Plan: Discharge home Labor precautions and fetal kick counts reviewed Increase water intake Rest more. Follow up with OB provider   Follow-up Information     [facility] Follow up.   Specialty: Obstetrics and Gynecology Contact information: [address] [place] [address] [phone]                Allergies as of 12/03/2022   No Known Allergies      Medication List     TAKE these medications    aspirin EC 81 MG tablet Take 1 tablet (81 mg total) by mouth daily. Swallow whole.   blood glucose meter kit and  supplies Kit Dispense based on patient and insurance preference. Check blood sugar fasting and 2 hours after a meal.   multivitamin-prenatal 27-0.8 MG Tabs tablet Take 1 tablet by mouth daily at 12 noon.   valACYclovir 500 MG tablet Commonly known as: Valtrex Take 1 tablet (500 mg total) by mouth 2 (two) times daily. Please begin taking one tablet twice daily starting at 36 weeks until you deliver.        Reva Bores, MD 12/03/2022 11:36 PM

## 2022-12-03 NOTE — MAU Note (Signed)
.  Julie Michael is a 27 y.o. at [redacted]w[redacted]d here in MAU reporting: increased pelvic pain and pressure since the beginning of the week. Happens about 4-5 time a day and last 15-20 min. Has had increased clear discharge  but does not think her water is broke. Denies any vag bleeding  and reports good fetal movement.   LMP:  Onset of complaint: beginning of the week  Pain score: 8 Vitals:   12/03/22 2235  BP: 114/67  Pulse: 100  Resp: 18  Temp: 98.8 F (37.1 C)     FHT:152 Lab orders placed from triage:  u/a

## 2022-12-06 ENCOUNTER — Other Ambulatory Visit: Payer: Self-pay | Admitting: *Deleted

## 2022-12-06 ENCOUNTER — Ambulatory Visit: Payer: BC Managed Care – PPO | Attending: Maternal & Fetal Medicine

## 2022-12-06 DIAGNOSIS — O99213 Obesity complicating pregnancy, third trimester: Secondary | ICD-10-CM | POA: Diagnosis not present

## 2022-12-06 DIAGNOSIS — E669 Obesity, unspecified: Secondary | ICD-10-CM | POA: Diagnosis not present

## 2022-12-06 DIAGNOSIS — O24410 Gestational diabetes mellitus in pregnancy, diet controlled: Secondary | ICD-10-CM

## 2022-12-06 DIAGNOSIS — Z3A34 34 weeks gestation of pregnancy: Secondary | ICD-10-CM

## 2022-12-15 ENCOUNTER — Ambulatory Visit: Payer: BC Managed Care – PPO | Admitting: Obstetrics and Gynecology

## 2022-12-15 VITALS — BP 116/78 | HR 88 | Wt 196.0 lb

## 2022-12-15 DIAGNOSIS — O24410 Gestational diabetes mellitus in pregnancy, diet controlled: Secondary | ICD-10-CM

## 2022-12-15 DIAGNOSIS — Z3A35 35 weeks gestation of pregnancy: Secondary | ICD-10-CM

## 2022-12-15 DIAGNOSIS — Z3403 Encounter for supervision of normal first pregnancy, third trimester: Secondary | ICD-10-CM

## 2022-12-15 NOTE — Progress Notes (Signed)
Pt reports fetal movement with some uterine irritability. Reports fasting BG today 89

## 2022-12-15 NOTE — Progress Notes (Signed)
   PRENATAL VISIT NOTE  Subjective:  Julie Michael is a 27 y.o. G1P0 at [redacted]w[redacted]d being seen today for ongoing prenatal care.  She is currently monitored for the following issues for this high-risk pregnancy and has Impacted cerumen of right ear; Genital warts; Encounter for supervision of normal first pregnancy in third trimester; HSV infection; and Gestational diabetes on their problem list.  Patient doing well with no acute concerns today. She reports no complaints.  Contractions: Irritability. Vag. Bleeding: None.  Movement: Present. Denies leaking of fluid.   The following portions of the patient's history were reviewed and updated as appropriate: allergies, current medications, past family history, past medical history, past social history, past surgical history and problem list. Problem list updated.  Objective:   Vitals:   12/15/22 1006  BP: 116/78  Pulse: 88  Weight: 196 lb (88.9 kg)    Fetal Status: Fetal Heart Rate (bpm): 140 Fundal Height: 36 cm Movement: Present     General:  Alert, oriented and cooperative. Patient is in no acute distress.  Skin: Skin is warm and dry. No rash noted.   Cardiovascular: Normal heart rate noted  Respiratory: Normal respiratory effort, no problems with respiration noted  Abdomen: Soft, gravid, appropriate for gestational age.  Pain/Pressure: Present     Pelvic: Cervical exam deferred        Extremities: Normal range of motion.  Edema: Trace  Mental Status:  Normal mood and affect. Normal behavior. Normal judgment and thought content.   Assessment and Plan:  Pregnancy: G1P0 at [redacted]w[redacted]d  1. [redacted] weeks gestation of pregnancy   2. Diet controlled gestational diabetes mellitus (GDM) in third trimester FBS: 82-89 PPBS: 85-129, most in range  3. Encounter for supervision of normal first pregnancy in third trimester Continue routine prenatal care  Preterm labor symptoms and general obstetric precautions including but not limited to vaginal  bleeding, contractions, leaking of fluid and fetal movement were reviewed in detail with the patient.  Please refer to After Visit Summary for other counseling recommendations.   Return in about 1 week (around 12/22/2022) for [facility], in person, 36 weeks swabs.   Mariel Aloe, MD Faculty Attending Center for [facility]

## 2022-12-22 ENCOUNTER — Encounter: Payer: Self-pay | Admitting: Obstetrics and Gynecology

## 2022-12-22 ENCOUNTER — Ambulatory Visit: Payer: BC Managed Care – PPO | Admitting: Obstetrics and Gynecology

## 2022-12-22 ENCOUNTER — Other Ambulatory Visit (HOSPITAL_COMMUNITY)
Admission: RE | Admit: 2022-12-22 | Discharge: 2022-12-22 | Disposition: A | Discharge status: Home / Self Care | Payer: BC Managed Care – PPO | Source: Ambulatory Visit | Attending: Obstetrics and Gynecology | Admitting: Obstetrics and Gynecology

## 2022-12-22 VITALS — BP 110/76 | HR 94 | Wt 197.0 lb

## 2022-12-22 DIAGNOSIS — O24410 Gestational diabetes mellitus in pregnancy, diet controlled: Secondary | ICD-10-CM

## 2022-12-22 DIAGNOSIS — Z3A36 36 weeks gestation of pregnancy: Secondary | ICD-10-CM

## 2022-12-22 DIAGNOSIS — B009 Herpesviral infection, unspecified: Secondary | ICD-10-CM

## 2022-12-22 DIAGNOSIS — Z3403 Encounter for supervision of normal first pregnancy, third trimester: Secondary | ICD-10-CM | POA: Diagnosis not present

## 2022-12-22 MED ORDER — VALACYCLOVIR HCL 500 MG PO TABS
500.0000 mg | ORAL_TABLET | Freq: Two times a day (BID) | ORAL | 6 refills | Status: AC
Start: 2022-12-22 — End: ?

## 2022-12-22 NOTE — Progress Notes (Signed)
Subjective:  Julie Michael is a 27 y.o. G1P0 at [redacted]w[redacted]d being seen today for ongoing prenatal care.  She is currently monitored for the following issues for this high-risk pregnancy and has Genital warts; Encounter for supervision of normal first pregnancy in third trimester; HSV infection; and Gestational diabetes on their problem list.  Patient reports  general discomforts of pregnancy .  Contractions: Irregular. Vag. Bleeding: None.  Movement: Present. Denies leaking of fluid.   The following portions of the patient's history were reviewed and updated as appropriate: allergies, current medications, past family history, past medical history, past social history, past surgical history and problem list. Problem list updated.  Objective:   Vitals:   12/22/22 0901  BP: 110/76  Pulse: 94  Weight: 197 lb (89.4 kg)    Fetal Status: Fetal Heart Rate (bpm): 140   Movement: Present     General:  Alert, oriented and cooperative. Patient is in no acute distress.  Skin: Skin is warm and dry. No rash noted.   Cardiovascular: Normal heart rate noted  Respiratory: Normal respiratory effort, no problems with respiration noted  Abdomen: Soft, gravid, appropriate for gestational age. Pain/Pressure: Present     Pelvic:  Cervical exam performed        Extremities: Normal range of motion.     Mental Status: Normal mood and affect. Normal behavior. Normal judgment and thought content.   Urinalysis:      Assessment and Plan:  Pregnancy: G1P0 at [redacted]w[redacted]d  1. Encounter for supervision of normal first pregnancy in third trimester Stable - Cervicovaginal ancillary only( [place]) - Culture, beta strep (group b only)  2. Diet controlled gestational diabetes mellitus (GDM) in third trimester CBG's in goal range Growth scan 01/04/23 IOL scheduled   3. HSV infection  - valACYclovir (VALTREX) 500 MG tablet; Take 1 tablet (500 mg total) by mouth 2 (two) times daily.  Dispense: 60 tablet; Refill:  6  Preterm labor symptoms and general obstetric precautions including but not limited to vaginal bleeding, contractions, leaking of fluid and fetal movement were reviewed in detail with the patient. Please refer to After Visit Summary for other counseling recommendations.  Return in about 1 week (around 12/29/2022) for OB visit, face to face, MD only.   Hermina Staggers, MD

## 2022-12-22 NOTE — Progress Notes (Signed)
Pt states she is having increase in lower back pain, not sleeping well.   Pt complains of ?ctx, has had more cramping.  Pt would like to discuss delivery, per MFM should deliver around 39 weeks.

## 2022-12-23 LAB — CERVICOVAGINAL ANCILLARY ONLY
Bacterial Vaginitis (gardnerella): NEGATIVE
Candida Glabrata: NEGATIVE
Candida Vaginitis: POSITIVE — AB
Chlamydia: NEGATIVE
Comment: NEGATIVE
Comment: NEGATIVE
Comment: NEGATIVE
Comment: NEGATIVE
Comment: NEGATIVE
Comment: NORMAL
Neisseria Gonorrhea: NEGATIVE
Trichomonas: NEGATIVE

## 2022-12-26 LAB — CULTURE, BETA STREP (GROUP B ONLY): Strep Gp B Culture: NEGATIVE

## 2022-12-27 ENCOUNTER — Encounter: Payer: Self-pay | Admitting: Obstetrics and Gynecology

## 2022-12-27 ENCOUNTER — Ambulatory Visit: Payer: BC Managed Care – PPO | Admitting: Obstetrics and Gynecology

## 2022-12-27 VITALS — BP 116/75 | HR 106 | Wt 199.0 lb

## 2022-12-27 DIAGNOSIS — Z3A37 37 weeks gestation of pregnancy: Secondary | ICD-10-CM

## 2022-12-27 DIAGNOSIS — Z3403 Encounter for supervision of normal first pregnancy, third trimester: Secondary | ICD-10-CM

## 2022-12-27 DIAGNOSIS — B009 Herpesviral infection, unspecified: Secondary | ICD-10-CM

## 2022-12-27 DIAGNOSIS — O24410 Gestational diabetes mellitus in pregnancy, diet controlled: Secondary | ICD-10-CM

## 2022-12-27 MED ORDER — PRENATAL 27-0.8 MG PO TABS
1.0000 | ORAL_TABLET | Freq: Every day | ORAL | 11 refills | Status: AC
Start: 2022-12-27 — End: ?

## 2022-12-27 NOTE — Patient Instructions (Signed)

## 2022-12-27 NOTE — Progress Notes (Signed)
Subjective:  Julie Michael is a 27 y.o. G1P0 at [redacted]w[redacted]d being seen today for ongoing prenatal care.  She is currently monitored for the following issues for this high-risk pregnancy and has Genital warts; Encounter for supervision of normal first pregnancy in third trimester; HSV infection; and Gestational diabetes on their problem list.  Patient reports  general discomforts of pregnancy .  Contractions: Irritability. Vag. Bleeding: None.  Movement: Present. Denies leaking of fluid.   The following portions of the patient's history were reviewed and updated as appropriate: allergies, current medications, past family history, past medical history, past social history, past surgical history and problem list. Problem list updated.  Objective:   Vitals:   12/27/22 0904  BP: 116/75  Pulse: (!) 106  Weight: 199 lb (90.3 kg)    Fetal Status: Fetal Heart Rate (bpm): 152   Movement: Present     General:  Alert, oriented and cooperative. Patient is in no acute distress.  Skin: Skin is warm and dry. No rash noted.   Cardiovascular: Normal heart rate noted  Respiratory: Normal respiratory effort, no problems with respiration noted  Abdomen: Soft, gravid, appropriate for gestational age. Pain/Pressure: Present     Pelvic:  Cervical exam deferred        Extremities: Normal range of motion.  Edema: None  Mental Status: Normal mood and affect. Normal behavior. Normal judgment and thought content.   Urinalysis:      Assessment and Plan:  Pregnancy: G1P0 at [redacted]w[redacted]d  1. Encounter for supervision of normal first pregnancy in third trimester Labor precautions  2. Diet controlled gestational diabetes mellitus (GDM) in third trimester CBG's in goal range Growth scan scheduled  3. HSV infection Continue with suppression  Term labor symptoms and general obstetric precautions including but not limited to vaginal bleeding, contractions, leaking of fluid and fetal movement were reviewed in detail with  the patient. Please refer to After Visit Summary for other counseling recommendations.  Return in about 1 week (around 01/03/2023) for OB visit, face to face, any provider.   Hermina Staggers, MD

## 2022-12-27 NOTE — Progress Notes (Signed)
ROB, IOL scheduled for 01/09/23.

## 2023-01-03 ENCOUNTER — Encounter: Payer: Self-pay | Admitting: Obstetrics and Gynecology

## 2023-01-03 ENCOUNTER — Encounter (HOSPITAL_COMMUNITY): Payer: Self-pay

## 2023-01-03 ENCOUNTER — Encounter: Payer: BC Managed Care – PPO | Admitting: Advanced Practice Midwife

## 2023-01-03 ENCOUNTER — Telehealth (HOSPITAL_COMMUNITY): Payer: Self-pay | Admitting: *Deleted

## 2023-01-03 ENCOUNTER — Ambulatory Visit (INDEPENDENT_AMBULATORY_CARE_PROVIDER_SITE_OTHER): Payer: BC Managed Care – PPO | Admitting: Obstetrics and Gynecology

## 2023-01-03 VITALS — BP 113/78 | HR 83 | Wt 198.0 lb

## 2023-01-03 DIAGNOSIS — O24410 Gestational diabetes mellitus in pregnancy, diet controlled: Secondary | ICD-10-CM

## 2023-01-03 DIAGNOSIS — B009 Herpesviral infection, unspecified: Secondary | ICD-10-CM

## 2023-01-03 DIAGNOSIS — Z3403 Encounter for supervision of normal first pregnancy, third trimester: Secondary | ICD-10-CM

## 2023-01-03 DIAGNOSIS — Z3A38 38 weeks gestation of pregnancy: Secondary | ICD-10-CM

## 2023-01-03 NOTE — Telephone Encounter (Signed)
Preadmission screen  

## 2023-01-03 NOTE — Progress Notes (Signed)
ROB, c/o itchy red rashes, stretch marks on stomach.  Induction 01/09/23 @12  am, be there at 11:45 pm on the 27th.

## 2023-01-03 NOTE — Progress Notes (Signed)
   PRENATAL VISIT NOTE  Subjective:  Julie Michael is a 27 y.o. G1P0 at [redacted]w[redacted]d being seen today for ongoing prenatal care.  She is currently monitored for the following issues for this high-risk pregnancy and has Genital warts; Encounter for supervision of normal first pregnancy in third trimester; HSV infection; and Gestational diabetes on their problem list.  Patient reports no complaints.  Contractions: Irritability. Vag. Bleeding: None.  Movement: Present. Denies leaking of fluid.   The following portions of the patient's history were reviewed and updated as appropriate: allergies, current medications, past family history, past medical history, past social history, past surgical history and problem list.   Objective:   Vitals:   01/03/23 0918  BP: 113/78  Pulse: 83  Weight: 198 lb (89.8 kg)    Fetal Status: Fetal Heart Rate (bpm): 158 Fundal Height: 38 cm Movement: Present     General:  Alert, oriented and cooperative. Patient is in no acute distress.  Skin: Skin is warm and dry. No rash noted.   Cardiovascular: Normal heart rate noted  Respiratory: Normal respiratory effort, no problems with respiration noted  Abdomen: Soft, gravid, appropriate for gestational age.  Pain/Pressure: Present     Pelvic: Cervical exam performed in the presence of a chaperone Dilation: Closed Effacement (%): Thick Station: Ballotable  Extremities: Normal range of motion.  Edema: None  Mental Status: Normal mood and affect. Normal behavior. Normal judgment and thought content.   Assessment and Plan:  Pregnancy: G1P0 at [redacted]w[redacted]d 1. Encounter for supervision of normal first pregnancy in third trimester Patient is doing well without complaints  2. Diet controlled gestational diabetes mellitus (GDM) in third trimester CBGs reviewed and within normal range Continue diet control Follow up growth ultrasound tomorrow Scheduled for IOL on 10/28  3. HSV infection Continue suppression therapy  Term  labor symptoms and general obstetric precautions including but not limited to vaginal bleeding, contractions, leaking of fluid and fetal movement were reviewed in detail with the patient. Please refer to After Visit Summary for other counseling recommendations.   Return in about 6 weeks (around 02/14/2023) for postpartum.  Future Appointments  Date Time Provider Department Center  01/04/2023  2:30 PM [facility] US4 [facility]  01/09/2023 12:00 AM [facility] SCHED ROOM [facility] None  01/10/2023  8:55 AM Jaileigh Weimer, Gigi Gin, MD [facility] None  01/16/2023  8:55 AM Xane Amsden, Gigi Gin, MD [facility] None    Catalina Antigua, MD

## 2023-01-04 ENCOUNTER — Other Ambulatory Visit: Payer: Self-pay

## 2023-01-04 ENCOUNTER — Ambulatory Visit: Payer: BC Managed Care – PPO | Attending: Obstetrics

## 2023-01-04 ENCOUNTER — Telehealth (HOSPITAL_COMMUNITY): Payer: Self-pay | Admitting: *Deleted

## 2023-01-04 ENCOUNTER — Encounter (HOSPITAL_COMMUNITY): Payer: Self-pay | Admitting: *Deleted

## 2023-01-04 ENCOUNTER — Ambulatory Visit: Payer: BC Managed Care – PPO

## 2023-01-04 DIAGNOSIS — E669 Obesity, unspecified: Secondary | ICD-10-CM

## 2023-01-04 DIAGNOSIS — O24410 Gestational diabetes mellitus in pregnancy, diet controlled: Secondary | ICD-10-CM

## 2023-01-04 DIAGNOSIS — O99213 Obesity complicating pregnancy, third trimester: Secondary | ICD-10-CM

## 2023-01-04 DIAGNOSIS — Z3A38 38 weeks gestation of pregnancy: Secondary | ICD-10-CM | POA: Diagnosis not present

## 2023-01-04 NOTE — Telephone Encounter (Signed)
Preadmission screen  

## 2023-01-05 LAB — BILE ACIDS, TOTAL: Bile Acids Total: <1 umol/L (ref 0.0–10.0)

## 2023-01-09 ENCOUNTER — Other Ambulatory Visit: Payer: Self-pay

## 2023-01-09 ENCOUNTER — Inpatient Hospital Stay (HOSPITAL_COMMUNITY): Payer: BC Managed Care – PPO | Admitting: Anesthesiology

## 2023-01-09 ENCOUNTER — Encounter (HOSPITAL_COMMUNITY): Payer: Self-pay | Admitting: Obstetrics and Gynecology

## 2023-01-09 ENCOUNTER — Inpatient Hospital Stay (HOSPITAL_COMMUNITY): Payer: BC Managed Care – PPO

## 2023-01-09 ENCOUNTER — Inpatient Hospital Stay (HOSPITAL_COMMUNITY)
Admission: AD | Admit: 2023-01-09 | Discharge: 2023-01-11 | DRG: 806 | Disposition: A | Discharge status: Home / Self Care | Payer: BC Managed Care – PPO | Attending: Family Medicine | Admitting: Family Medicine

## 2023-01-09 DIAGNOSIS — A6000 Herpesviral infection of urogenital system, unspecified: Secondary | ICD-10-CM | POA: Diagnosis not present

## 2023-01-09 DIAGNOSIS — Z833 Family history of diabetes mellitus: Secondary | ICD-10-CM

## 2023-01-09 DIAGNOSIS — Z20822 Contact with and (suspected) exposure to covid-19: Secondary | ICD-10-CM | POA: Diagnosis present

## 2023-01-09 DIAGNOSIS — Z3A39 39 weeks gestation of pregnancy: Secondary | ICD-10-CM

## 2023-01-09 DIAGNOSIS — Z7982 Long term (current) use of aspirin: Secondary | ICD-10-CM | POA: Diagnosis not present

## 2023-01-09 DIAGNOSIS — O24420 Gestational diabetes mellitus in childbirth, diet controlled: Secondary | ICD-10-CM | POA: Diagnosis not present

## 2023-01-09 DIAGNOSIS — O9832 Other infections with a predominantly sexual mode of transmission complicating childbirth: Secondary | ICD-10-CM | POA: Diagnosis not present

## 2023-01-09 DIAGNOSIS — Z8249 Family history of ischemic heart disease and other diseases of the circulatory system: Secondary | ICD-10-CM

## 2023-01-09 DIAGNOSIS — Z3490 Encounter for supervision of normal pregnancy, unspecified, unspecified trimester: Principal | ICD-10-CM | POA: Diagnosis present

## 2023-01-09 DIAGNOSIS — O134 Gestational [pregnancy-induced] hypertension without significant proteinuria, complicating childbirth: Secondary | ICD-10-CM | POA: Diagnosis not present

## 2023-01-09 LAB — TYPE AND SCREEN
ABO/RH(D): O POS
Antibody Screen: NEGATIVE

## 2023-01-09 LAB — GLUCOSE, CAPILLARY
Glucose-Capillary: 104 mg/dL — ABNORMAL HIGH (ref 70–99)
Glucose-Capillary: 110 mg/dL — ABNORMAL HIGH (ref 70–99)
Glucose-Capillary: 98 mg/dL (ref 70–99)
Glucose-Capillary: 118 mg/dL — ABNORMAL HIGH (ref 70–99)

## 2023-01-09 LAB — CBC
HCT: 39.7 % (ref 36.0–46.0)
Hemoglobin: 12.7 g/dL (ref 12.0–15.0)
MCH: 27.8 pg (ref 26.0–34.0)
MCHC: 32 g/dL (ref 30.0–36.0)
MCV: 86.9 fL (ref 80.0–100.0)
Platelets: 254 10*3/uL (ref 150–400)
RBC: 4.57 MIL/uL (ref 3.87–5.11)
RDW: 13.5 % (ref 11.5–15.5)
WBC: 8 10*3/uL (ref 4.0–10.5)
nRBC: 0.3 % — ABNORMAL HIGH (ref 0.0–0.2)

## 2023-01-09 LAB — RPR: RPR Ser Ql: NONREACTIVE

## 2023-01-09 MED ORDER — EPHEDRINE 5 MG/ML INJ: 10.0000 mg | INTRAVENOUS | Status: DC | PRN

## 2023-01-09 MED ORDER — OXYCODONE HCL 5 MG PO TABS: 5.0000 mg | ORAL_TABLET | ORAL | Status: DC | PRN

## 2023-01-09 MED ORDER — IBUPROFEN 600 MG PO TABS
600.0000 mg | ORAL_TABLET | Freq: Four times a day (QID) | ORAL | Status: DC
  Administered 2023-01-09 – 2023-01-11 (×7): 600 mg via ORAL
  Filled 2023-01-09 (×7): qty 1

## 2023-01-09 MED ORDER — DIPHENHYDRAMINE HCL 50 MG/ML IJ SOLN: 12.5000 mg | INTRAMUSCULAR | Status: DC | PRN

## 2023-01-09 MED ORDER — SOD CITRATE-CITRIC ACID 500-334 MG/5ML PO SOLN: 30.0000 mL | ORAL | Status: DC | PRN

## 2023-01-09 MED ORDER — DIPHENHYDRAMINE HCL 25 MG PO CAPS: 25.0000 mg | ORAL_CAPSULE | Freq: Four times a day (QID) | ORAL | Status: DC | PRN

## 2023-01-09 MED ORDER — MISOPROSTOL 25 MCG QUARTER TABLET
25.0000 ug | ORAL_TABLET | ORAL | Status: DC
  Administered 2023-01-09: 25 ug via VAGINAL
  Filled 2023-01-09: qty 1

## 2023-01-09 MED ORDER — ZOLPIDEM TARTRATE 5 MG PO TABS: 5.0000 mg | ORAL_TABLET | Freq: Every evening | ORAL | Status: DC | PRN

## 2023-01-09 MED ORDER — BENZOCAINE-MENTHOL 20-0.5 % EX AERO
1.0000 | INHALATION_SPRAY | CUTANEOUS | Status: DC | PRN
  Filled 2023-01-09: qty 56

## 2023-01-09 MED ORDER — SODIUM CHLORIDE 0.9% FLUSH
3.0000 mL | INTRAVENOUS | Status: DC | PRN
Start: 2023-01-09 — End: 2023-01-09

## 2023-01-09 MED ORDER — MISOPROSTOL 50MCG HALF TABLET
50.0000 ug | ORAL_TABLET | Freq: Once | ORAL | Status: AC
  Administered 2023-01-09: 50 ug via ORAL
  Filled 2023-01-09: qty 1

## 2023-01-09 MED ORDER — FENTANYL-BUPIVACAINE-NACL 0.5-0.125-0.9 MG/250ML-% EP SOLN
12.0000 mL/h | EPIDURAL | Status: DC | PRN
  Administered 2023-01-09: 12 mL/h via EPIDURAL
  Filled 2023-01-09: qty 250

## 2023-01-09 MED ORDER — PRENATAL MULTIVITAMIN CH
1.0000 | ORAL_TABLET | Freq: Every day | ORAL | Status: DC
  Administered 2023-01-10 – 2023-01-11 (×2): 1 via ORAL
  Filled 2023-01-09 (×2): qty 1

## 2023-01-09 MED ORDER — ONDANSETRON HCL 4 MG/2ML IJ SOLN
4.0000 mg | Freq: Four times a day (QID) | INTRAMUSCULAR | Status: DC | PRN
Start: 2023-01-09 — End: 2023-01-09

## 2023-01-09 MED ORDER — MISOPROSTOL 50MCG HALF TABLET
50.0000 ug | ORAL_TABLET | Freq: Once | ORAL | Status: AC
  Administered 2023-01-09: 50 ug via BUCCAL
  Filled 2023-01-09: qty 1

## 2023-01-09 MED ORDER — COCONUT OIL OIL: 1.0000 | TOPICAL_OIL | Status: DC | PRN

## 2023-01-09 MED ORDER — DIBUCAINE (PERIANAL) 1 % EX OINT: 1.0000 | TOPICAL_OINTMENT | CUTANEOUS | Status: DC | PRN

## 2023-01-09 MED ORDER — OXYTOCIN BOLUS FROM INFUSION
333.0000 mL | Freq: Once | INTRAVENOUS | Status: AC
  Administered 2023-01-09: 333 mL via INTRAVENOUS

## 2023-01-09 MED ORDER — SODIUM CHLORIDE 0.9% FLUSH: 3.0000 mL | Freq: Two times a day (BID) | INTRAVENOUS | Status: DC

## 2023-01-09 MED ORDER — ACETAMINOPHEN 325 MG PO TABS
650.0000 mg | ORAL_TABLET | ORAL | Status: DC | PRN
  Administered 2023-01-09 – 2023-01-10 (×3): 650 mg via ORAL
  Filled 2023-01-09 (×3): qty 2

## 2023-01-09 MED ORDER — ONDANSETRON HCL 4 MG/2ML IJ SOLN: 4.0000 mg | INTRAMUSCULAR | Status: DC | PRN

## 2023-01-09 MED ORDER — OXYCODONE HCL 5 MG PO TABS: 10.0000 mg | ORAL_TABLET | ORAL | Status: DC | PRN

## 2023-01-09 MED ORDER — LACTATED RINGERS IV SOLN: 500.0000 mL | Freq: Once | INTRAVENOUS | Status: DC

## 2023-01-09 MED ORDER — SODIUM CHLORIDE 0.9 % IV SOLN: 250.0000 mL | INTRAVENOUS | Status: DC | PRN

## 2023-01-09 MED ORDER — LIDOCAINE-EPINEPHRINE (PF) 1.5 %-1:200000 IJ SOLN
INTRAMUSCULAR | Status: DC | PRN
  Administered 2023-01-09: 5 mL via EPIDURAL

## 2023-01-09 MED ORDER — WITCH HAZEL-GLYCERIN EX PADS: 1.0000 | MEDICATED_PAD | CUTANEOUS | Status: DC | PRN

## 2023-01-09 MED ORDER — ONDANSETRON HCL 4 MG PO TABS: 4.0000 mg | ORAL_TABLET | ORAL | Status: DC | PRN

## 2023-01-09 MED ORDER — LIDOCAINE HCL (PF) 1 % IJ SOLN
INTRAMUSCULAR | Status: DC | PRN
  Administered 2023-01-09: 5 mL via EPIDURAL

## 2023-01-09 MED ORDER — TETANUS-DIPHTH-ACELL PERTUSSIS 5-2.5-18.5 LF-MCG/0.5 IM SUSY: 0.5000 mL | PREFILLED_SYRINGE | Freq: Once | INTRAMUSCULAR | Status: DC

## 2023-01-09 MED ORDER — TERBUTALINE SULFATE 1 MG/ML IJ SOLN: 0.2500 mg | Freq: Once | INTRAMUSCULAR | Status: DC | PRN

## 2023-01-09 MED ORDER — MISOPROSTOL 25 MCG QUARTER TABLET
25.0000 ug | ORAL_TABLET | Freq: Once | ORAL | Status: AC
  Administered 2023-01-09: 25 ug via VAGINAL
  Filled 2023-01-09: qty 1

## 2023-01-09 MED ORDER — LIDOCAINE HCL (PF) 1 % IJ SOLN
30.0000 mL | INTRAMUSCULAR | Status: AC | PRN
  Administered 2023-01-09: 30 mL via SUBCUTANEOUS
  Filled 2023-01-09: qty 30

## 2023-01-09 MED ORDER — PHENYLEPHRINE 80 MCG/ML (10ML) SYRINGE FOR IV PUSH (FOR BLOOD PRESSURE SUPPORT): 80.0000 ug | PREFILLED_SYRINGE | INTRAVENOUS | Status: DC | PRN

## 2023-01-09 MED ORDER — PHENYLEPHRINE 80 MCG/ML (10ML) SYRINGE FOR IV PUSH (FOR BLOOD PRESSURE SUPPORT)
80.0000 ug | PREFILLED_SYRINGE | INTRAVENOUS | Status: DC | PRN
  Filled 2023-01-09: qty 10

## 2023-01-09 MED ORDER — FENTANYL CITRATE (PF) 100 MCG/2ML IJ SOLN: 50.0000 ug | INTRAMUSCULAR | Status: DC | PRN

## 2023-01-09 MED ORDER — ACETAMINOPHEN 325 MG PO TABS: 650.0000 mg | ORAL_TABLET | ORAL | Status: DC | PRN

## 2023-01-09 MED ORDER — OXYTOCIN-SODIUM CHLORIDE 30-0.9 UT/500ML-% IV SOLN
2.5000 [IU]/h | INTRAVENOUS | Status: DC
  Administered 2023-01-09: 2.5 [IU]/h via INTRAVENOUS
  Filled 2023-01-09: qty 500

## 2023-01-09 MED ORDER — SIMETHICONE 80 MG PO CHEW: 80.0000 mg | CHEWABLE_TABLET | ORAL | Status: DC | PRN

## 2023-01-09 MED ORDER — LACTATED RINGERS IV SOLN: 500.0000 mL | INTRAVENOUS | Status: DC | PRN

## 2023-01-09 MED ORDER — SENNOSIDES-DOCUSATE SODIUM 8.6-50 MG PO TABS
2.0000 | ORAL_TABLET | Freq: Every day | ORAL | Status: DC
  Administered 2023-01-10 – 2023-01-11 (×2): 2 via ORAL
  Filled 2023-01-09 (×2): qty 2

## 2023-01-09 NOTE — Progress Notes (Signed)
Patient ID: Julie Michael, female   DOB: 12/02/1995, 27 y.o.   MRN: [id number]  BP 124/71   Pulse 93   Temp 98.7 F (37.1 C)   Resp 17   Ht 5' (1.524 m)   Wt 201 lb 1.6 oz (91.2 kg)   LMP 04/11/2022 (Exact Date)   BMI 39.27 kg/m    Dilation: 2.5 Effacement (%): 40 Station: -3 Presentation: Vertex Exam by:: Jaeson Molstad W CNM  FHR: Cat 1  Pt nauseous and vomiting with some contractions, reporting lower back pain and pelvic pressure, contractions still coming q51min. Doula, FOB and grandmothers at bedside for support. Pt disappointed by cervical exam but copious reassurance given by doula, family and myself. Wants to get in the shower, requested at least 10 more minutes of monitoring and then she can get in the shower. Resting on the toilet. Will continue position changes, may get nitrous for pain relief.   Edd Arbour, CNM, MSN, IBCLC Certified Nurse Midwife, [facility]

## 2023-01-09 NOTE — Anesthesia Preprocedure Evaluation (Signed)
Anesthesia Evaluation  Patient identified by MRN, date of birth, ID band Patient awake    Reviewed: Allergy & Precautions, H&P , NPO status , Patient's Chart, lab work & pertinent test results  History of Anesthesia Complications Negative for: history of anesthetic complications  Airway Mallampati: II       Dental no notable dental hx.    Pulmonary neg pulmonary ROS   Pulmonary exam normal        Cardiovascular negative cardio ROS Normal cardiovascular exam     Neuro/Psych negative neurological ROS  negative psych ROS   GI/Hepatic negative GI ROS, Neg liver ROS,,,  Endo/Other  negative endocrine ROSdiabetes    Renal/GU negative Renal ROS  negative genitourinary   Musculoskeletal   Abdominal  (+) + obese  Peds  Hematology negative hematology ROS (+)   Anesthesia Other Findings   Reproductive/Obstetrics (+) Pregnancy                             Anesthesia Physical Anesthesia Plan  ASA: 2  Anesthesia Plan: Epidural   Post-op Pain Management:    Induction:   PONV Risk Score and Plan:   Airway Management Planned:   Additional Equipment:   Intra-op Plan:   Post-operative Plan:   Informed Consent: I have reviewed the patients History and Physical, chart, labs and discussed the procedure including the risks, benefits and alternatives for the proposed anesthesia with the patient or authorized representative who has indicated his/her understanding and acceptance.       Plan Discussed with:   Anesthesia Plan Comments:        Anesthesia Quick Evaluation

## 2023-01-09 NOTE — Discharge Summary (Signed)
Postpartum Discharge Summary  Date of Service updated***     Patient Name: Julie Michael DOB: November 22, 1995 MRN: [id number]  Date of admission: 01/09/2023 Delivery date:01/09/2023 Delivering provider: Edd Arbour R Date of discharge: 01/09/2023  Admitting diagnosis: Encounter for induction of labor [Z34.90] Intrauterine pregnancy: [redacted]w[redacted]d     Secondary diagnosis:  Principal Problem:   Encounter for induction of labor  Additional problems: ***    Discharge diagnosis: Term Pregnancy Delivered and GDM A1                                              Post partum procedures:*** Augmentation: Cytotec Complications: None  Hospital course: Induction of Labor With Vaginal Delivery   27 y.o. yo G1P0 at [redacted]w[redacted]d was admitted to the hospital 01/09/2023 for induction of labor.  Indication for induction: A1 DM.  Patient had an labor course complicated by none. Membrane Rupture Time/Date: 11:50 AM,01/09/2023  Delivery Method:Vaginal, Spontaneous Operative Delivery:N/A Episiotomy: None Lacerations:  2nd degree;Perineal Details of delivery can be found in separate delivery note.  Patient had a postpartum course complicated by ***. Patient is discharged home 01/09/23.  Newborn Data: Birth date:01/09/2023 Birth time:7:30 PM Gender:Female Living status:Living Apgars:6 ,9  Weight:   Magnesium Sulfate received: No BMZ received: No Rhophylac:N/A MMR:N/A T-DaP: declined Flu: No RSV Vaccine received: No Transfusion:{Transfusion received:30440034}  Immunizations received: Immunization History  Administered Date(s) Administered   Hepatitis A 10/17/2006   Influenza Split 02/21/2011, 03/15/2012   Influenza Whole 12/13/2007   Influenza, Seasonal, Injecte, Preservative Fre 11/22/2022   Influenza,inj,Quad PF,6+ Mos 01/22/2013, 01/23/2015, 01/16/2018   Influenza-Unspecified 01/23/2018, 11/15/2018   Meningococcal Conjugate 03/15/2012   Meningococcal polysaccharide vaccine (MPSV4) 11/01/2010    PFIZER Comirnaty(Gray Top)Covid-19 Tri-Sucrose Vaccine 07/08/2019, 07/29/2019   Td 10/17/2006   Tdap 11/01/2022   Physical exam  Vitals:   01/09/23 2003 01/09/23 2005 01/09/23 2016 01/09/23 2022  BP: (!) 192/168 (!) 209/165 (!) 221/193 (!) 111/92  Pulse: (!) 102 (!) 102 (!) 112 (!) 107  Resp: 16 18  16   Temp:      TempSrc:      SpO2:      Weight:      Height:       General: {Exam; general:21111117} Lochia: {Desc; appropriate/inappropriate:30686::"appropriate"} Uterine Fundus: {Desc; firm/soft:30687} Incision: {Exam; incision:21111123} DVT Evaluation: {Exam; dvt:2111122} Labs: Lab Results  Component Value Date   WBC 8.0 01/09/2023   HGB 12.7 01/09/2023   HCT 39.7 01/09/2023   MCV 86.9 01/09/2023   PLT 254 01/09/2023      Latest Ref Rng & Units 03/15/2012    4:17 PM  CMP  Glucose 70 - 99 mg/dL 86   BUN 6 - 23 mg/dL 8   Creatinine 4.09 - 8.11 mg/dL 9.14   Sodium 782 - 956 mEq/L 138   Potassium 3.5 - 5.3 mEq/L 3.8   Chloride 96 - 112 mEq/L 106   CO2 19 - 32 mEq/L 21   Calcium 8.4 - 10.5 mg/dL 9.2    Edinburgh Score:     No data to display         No data recorded  After visit meds:  Allergies as of 01/09/2023   No Known Allergies   Med Rec must be completed prior to using this [facility]***      Discharge home in stable condition Infant Feeding: {Baby feeding:23562} Infant Disposition:{CHL IP OB HOME WITH  MVHQIO:96295} Discharge instruction: per After Visit Summary and Postpartum booklet. Activity: Advance as tolerated. Pelvic rest for 6 weeks.  Diet: {OB MWUX:32440102} Future Appointments:No future appointments.  Follow up Visit: Visit scheduled by Tyler Aas Please schedule this patient for a In person postpartum visit in 4 weeks with the following provider:  Edd Arbour, CNM . Additional Postpartum F/U:2 hour GTT  Low risk pregnancy complicated by: HTN Delivery mode:  Vaginal, Spontaneous Anticipated Birth Control:    NFP   01/09/2023 Bernerd Limbo, CNM

## 2023-01-09 NOTE — Progress Notes (Signed)
Patient discussed her birth plan with me . I advised her of the importance of an iv , even if it is only saline locked. The doula and the patient discussed with me that they would request pain interventions from me and to not ask the patient. I advised her that options will be limited with no IV access and if they reached the point where they wanted iv pain medication or an epidural this would take time and if she has eaten solid foods like she explained she would , anesthesia may have her wait 6-8 hrs before placing an epidural. Patient and family verbalized understanding.

## 2023-01-09 NOTE — Progress Notes (Signed)
Patient ID: Julie Michael, female   DOB: [birth year], 27 y.o.   MRN: [id number]  Epidural in place. Cat 1 tracing. Pt comfortable, IUPC placed to adequately measure contraction pattern/strength. Anticipate SVD.  Edd Arbour, CNM, MSN, IBCLC Certified Nurse Midwife, [facility]

## 2023-01-09 NOTE — Progress Notes (Signed)
Applied monitors on patient and hooked up nitrous gas as requested. Informed patient that monitors needed to be applied and patient needed to move to the bed before administration. Patient would need to be monitored on pulse oximetry for the beginning of the administration

## 2023-01-09 NOTE — Progress Notes (Signed)
Patient ID: Linzie Blankenship, female   DOB: 31-Jan-1996, 27 y.o.   MRN: [id number]  Labor Progress Note Lorelai Merta is a 27 y.o. G1P0 at [redacted]w[redacted]d presented for IOL for A1GDM  S:  Pt standing and breathing with contractions, feeling back pressure and contractions have picked up in intensity. Doula, FOB and PGM at bedside giving support.  O:  BP 124/71   Pulse 93   Temp 98.7 F (37.1 C)   Resp 17   Ht 5' (1.524 m)   Wt 201 lb 1.6 oz (91.2 kg)   LMP 04/11/2022 (Exact Date)   BMI 39.27 kg/m  EFM: baseline 145 bpm/ minimal to moderate variability/ positive accels/ variable decels  Toco/IUPC: q49min SVE: Dilation: 1.5 Effacement (%): 40 Station: -3 Presentation: Vertex Exam by:: Dr Leanora Cover Pitocin: none  A/P: 27 y.o. G1P0 [redacted]w[redacted]d  1. Labor: Appears to be progressing well, picked up after last dose of cytotec and ambulation. Has continued to have regular, intense contractions despite position changes. 2. FWB: Cat 1 overall, a little less reactive, requested IV access be replaced for fluid bolus and explained why based on fetal reactivity, pt agreed. 3. Pain: Coping well with doula support and hip squeezes, will request meds if she decides 4. A1GDM: Last CBG 110, was not fasting. Staying on clears in case of epidural placement.  Copious reassurance given, demonstrated counter pressure and applied peppermint oil after an episode of emesis. Potentially progressing quickly, but may be transitioning into active labor. Will offer cervical exam once IV is replaced (had to call IV team) and pt willing to allow.  Edd Arbour, CNM, MSN, IBCLC Certified Nurse Midwife, [facility]

## 2023-01-09 NOTE — Progress Notes (Signed)
Patient agreed to IV placement for fluid resuscitation for her and baby , IV  team consult completed.

## 2023-01-09 NOTE — Anesthesia Procedure Notes (Signed)
Epidural Patient location during procedure: OB Start time: 01/09/2023 2:04 PM End time: 01/09/2023 2:14 PM  Staffing Anesthesiologist: Leonides Grills, MD Performed: anesthesiologist   Preanesthetic Checklist Completed: patient identified, IV checked, site marked, risks and benefits discussed, monitors and equipment checked, pre-op evaluation and timeout performed  Epidural Patient position: sitting Prep: DuraPrep Patient monitoring: heart rate, cardiac monitor, continuous pulse ox and blood pressure Approach: midline Location: L3-L4 Injection technique: LOR air  Needle:  Needle type: Tuohy  Needle gauge: 17 G Needle length: 9 cm Needle insertion depth: 8 cm Catheter type: closed end flexible Catheter size: 19 Gauge Catheter at skin depth: 14 cm Test dose: negative and 1.5% lidocaine with Epi 1:200 K  Assessment Events: blood not aspirated, no cerebrospinal fluid, injection not painful, no injection resistance and negative IV test  Additional Notes Informed consent obtained prior to proceeding including risk of failure, 1% risk of PDPH, risk of minor discomfort and bruising. Discussed alternatives to epidural analgesia and patient desires to proceed.  Timeout performed pre-procedure verifying patient name, procedure, and platelet count.  Patient tolerated procedure well. Reason for block:procedure for pain

## 2023-01-09 NOTE — H&P (Addendum)
OBSTETRIC ADMISSION HISTORY AND PHYSICAL  Julie Michael is a 27 y.o. female G1P0 with IUP at [redacted]w[redacted]d by LMP presenting for IOL for A1GDM. She reports +FMs, No LOF, no VB, no blurry vision, headaches or peripheral edema, and RUQ pain.  She plans on breast feeding. She requests possible IUD for birth control. She received her prenatal care at  [facility] .  Endorses good adherence to Valtrex regimen.  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  Past Medical History: Past Medical History:  Diagnosis Date   Amenorrhea 04/13/2016   BV (bacterial vaginosis) 12/21/2018   Exposure to COVID-19 virus 09/11/2018   Genital lesion, female 12/21/2018   Gestational diabetes    Healthcare maintenance 07/28/2017   Herpes    Morning sickness 06/16/2022   Routine screening for STI (sexually transmitted infection) 09/08/2016   Sinusitis, chronic 08/09/2013   Strain of abdominal muscle 03/30/2018   Vagina, candidiasis 12/21/2018   Vaginal discharge 12/05/2011   Vaginal Pap smear, abnormal    VISUAL ACUITY, DECREASED, LEFT EYE 05/26/2009   Qualifier: Diagnosis of   By: Lorenda Hatchet CMA,, Thekla       Yeast infection    Yeast infection 03/30/2018    Past Surgical History: Past Surgical History:  Procedure Laterality Date   WISDOM TOOTH EXTRACTION      Obstetrical History: OB History     Gravida  1   Para      Term      Preterm      AB      Living         SAB      IAB      Ectopic      Multiple      Live Births              Social History Social History   Socioeconomic History   Marital status: Single    Spouse name: Not on file   Number of children: Not on file   Years of education: Not on file   Highest education level: Some college, no degree  Occupational History   Not on file  Tobacco Use   Smoking  status: Never    Passive exposure: Never   Smokeless tobacco: Never  Vaping Use   Vaping status: Never Used  Substance and Sexual Activity   Alcohol use: Not Currently    Comment: occas   Drug use: No   Sexual activity: Yes    Birth control/protection: None  Other Topics Concern   Not on file  Social History Narrative   Not on file   Social Determinants of Health   Financial Resource Strain: Low Risk  (10/25/2022)   Overall Financial Resource Strain (CARDIA)    Difficulty of Paying Living Expenses: Not very hard  Food Insecurity: No Food Insecurity (01/09/2023)   Hunger Vital Sign    Worried About Running Out of Food in the Last Year: Never true    Ran Out of Food in the Last Year: Never true  Recent Concern: Food Insecurity - Food Insecurity Present (10/25/2022)   Hunger Vital Sign    Worried About Running Out of Food in the Last Year: Sometimes true    Ran Out of Food in the Last Year: Sometimes true  Transportation Needs: No Transportation Needs (01/09/2023)   PRAPARE - Transportation    Lack  of Transportation (Medical): No    Lack of Transportation (Non-Medical): No  Physical Activity: Insufficiently Active (10/25/2022)   Exercise Vital Sign    Days of Exercise per Week: 4 days    Minutes of Exercise per Session: 30 min  Stress: No Stress Concern Present (10/25/2022)   [employer] of Occupational Health - Occupational Stress Questionnaire    Feeling of Stress : Only a little  Social Connections: Moderately Isolated (10/25/2022)   Social Connection and Isolation Panel [NHANES]    Frequency of Communication with Friends and Family: More than three times a week    Frequency of Social Gatherings with Friends and Family: Twice a week    Attends Religious Services: More than 4 times per year    Active Member of [employer] or Organizations: No    Attends Engineer, structural: Not on file    Marital Status: Never married    Family History: Family History  Problem  Relation Age of Onset   Diabetes Mother    Hypertension Mother    Diabetes Other    Hypertension Other    Asthma Neg Hx    Cancer Neg Hx    Heart disease Neg Hx     Allergies: No Known Allergies  Medications Prior to Admission  Medication Sig Dispense Refill Last Dose   aspirin EC 81 MG tablet Take 1 tablet (81 mg total) by mouth daily. Swallow whole. 30 tablet 12 01/09/2023   Prenatal Vit-Fe Fumarate-FA (MULTIVITAMIN-PRENATAL) 27-0.8 MG TABS tablet Take 1 tablet by mouth daily at 12 noon. 30 tablet 11 01/09/2023   valACYclovir (VALTREX) 500 MG tablet Take 1 tablet (500 mg total) by mouth 2 (two) times daily. 60 tablet 6 01/09/2023   blood glucose meter kit and supplies KIT Dispense based on patient and insurance preference. Check blood sugar fasting and 2 hours after a meal. 1 each 0     Review of Systems  All systems reviewed and negative except as stated in HPI  Physical Exam Blood pressure 134/82, pulse 91, temperature 97.6 F (36.4 C), temperature source Oral, resp. rate 16, height 5' (1.524 m), weight 91.2 kg, last menstrual period 04/11/2022.  General appearance: alert and cooperative Lungs: Clear to auscultation bilaterally Heart: Regular rate and rhythm Abdomen: Soft, non-tender; bowel sounds normal Pelvic: Deferred due to numerous family members Extremities: Homans sign is negative, no sign of DVT DTR's 2+  Presentation: cephalic, OA, confirmed by US Fetal monitoring: Baseline 145/Moderate variability/15x15 accels/no decels Uterine activity: Every 2-3 minutes Dilation: Closed Effacement (%): Thick Station: Ballotable Presentation: Vertex Exam by:: Kathi Simpers, RN   Prenatal labs: ABO, Rh: --/--/O POS (10/28 0149) Antibody: NEG (10/28 0149) Rubella: 3.01 (03/12 0928) RPR: Non Reactive (08/20 1732)  HBsAg: Negative (03/12 0928)  HIV: Non Reactive (08/20 1732)  GBS: Negative/-- (10/10 0942)  GTT: GDM+ Genetic screening: Declined Anatomy US:  Normal  Prenatal Transfer Tool  Maternal Diabetes: Yes:  Diabetes Type:  Diet controlled Genetic Screening: Declined Maternal Ultrasounds/Referrals: Normal Fetal Ultrasounds or other Referrals:  Referred to Materal Fetal Medicine  Maternal Substance Abuse:  No Significant Maternal Medications:  Meds include: Other: Valtrex Significant Maternal Lab Results: Group B Strep negative, HSV positive  Results for orders placed or performed during the hospital encounter of 01/09/23 (from the past 24 hour(s))  Glucose, capillary   Collection Time: 01/09/23  1:13 AM  Result Value Ref Range   Glucose-Capillary 104 (H) 70 - 99 mg/dL  Type and screen [facility]  Collection Time: 01/09/23  1:49 AM  Result Value Ref Range   ABO/RH(D) O POS    Antibody Screen NEG    Sample Expiration      01/12/2023,2359 Performed at [facility], 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 G1P0 at [redacted]w[redacted]d here for IOL for A1GDM.  #Labor: dCytotec administered.  Recheck in 4 hours and consider FB versus pitocin for further augmentation. #Pain: Per patient request, counseled on options #Fetal Well Being: Cat I #ID: [id number] #Method Of Feeding: breast feeding #Method Of Contraception: natural family planning (NFP) #Circ: N/A  #HSV positive:  Adherent to prophylaxis regimen.  Will verify absence of lesions with speculum exam at next check and then clear for vaginal delivery. #A1GDM: No known fetal macrosomia (3397 g, 60% EFW) as of last [place].  CBGs Q4h.  CTM.  Julie Michael [facility] PGY-1 01/09/23 3:14 AM  GME ATTESTATION:  Evaluation and management procedures were performed by the [facility] Resident under my supervision. I was immediately available for direct supervision, assistance and direction throughout this encounter.  I also confirm that I have verified the information documented in the resident's note, and that I have also personally reperformed the pertinent components of the physical exam and all of the medical decision making activities.  I have also made any necessary editorial changes.  Wyn Forster, MD OB Fellow, Faculty Practice [facility], Center for [facility] 01/09/2023 3:24 AM

## 2023-01-10 ENCOUNTER — Encounter: Payer: BC Managed Care – PPO | Admitting: Obstetrics and Gynecology

## 2023-01-10 LAB — CBC
HCT: 40.6 % (ref 36.0–46.0)
Hemoglobin: 12.7 g/dL (ref 12.0–15.0)
MCH: 27.5 pg (ref 26.0–34.0)
MCHC: 31.3 g/dL (ref 30.0–36.0)
MCV: 88.1 fL (ref 80.0–100.0)
Platelets: 238 10*3/uL (ref 150–400)
RBC: 4.61 MIL/uL (ref 3.87–5.11)
RDW: 13.7 % (ref 11.5–15.5)
WBC: 14.4 10*3/uL — ABNORMAL HIGH (ref 4.0–10.5)
nRBC: 0 % (ref 0.0–0.2)

## 2023-01-10 NOTE — Lactation Note (Signed)
This note was copied from a baby's chart. Lactation Consultation Note  Patient Name: Julie Michael [id number] Date: 01/10/2023 Age:27 hours  Mother was sleeping when LC rounded. Will follow.      Consult Status  In-patient  Follow-up 10/30/ 2024    Omar Person 01/10/2023, 5:49 PM

## 2023-01-10 NOTE — Lactation Note (Signed)
This note was copied from a baby's chart. Lactation Consultation Note  Patient Name: Julie Michael OZDGU'Y Date: 01/10/2023 Age:27 hours Reason for consult: Initial assessment;1st time breastfeeding;Term Per MOB, she feels breastfeeding is going well, infant BF for 20 minutes in L&D, BF on MBU for 15 minutes at 12 am. MOB doesn't have any questions or concerns for LC at this time. LC discussed infant's input and output, Per FOB, infant had one stool in L&D. MOB will continue to BF infant by cues, on demand, every 2-3 hours, skin to skin. MOB knows to call for latch assistance if needed. LC discussed the importance of maternal rest, diet and hydration. LC discussed hand expression with breast model and MOB taught back. MOB was  made aware of O/P services, breastfeeding support groups, community resources, and our phone # for post-discharge questions.    Maternal Data Has patient been taught Hand Expression?: Yes Does the patient have breastfeeding experience prior to this delivery?: No  Feeding Mother's Current Feeding Choice: Breast Milk and Formula  LATCH Score  LC did not observe latch at this time, due to infant recently BF 1 hour prior to [facility] entering the room.                  Lactation Tools Discussed/Used    Interventions Interventions: Breast feeding basics reviewed;Skin to skin;Support pillows;Position options;Hand express;Education;LC Services brochure  Discharge Pump: DEBP;Personal  Consult Status Consult Status: Follow-up Date: 01/10/23 Follow-up type: In-patient    Frederico Hamman 01/10/2023, 1:17 AM

## 2023-01-10 NOTE — Progress Notes (Signed)
Post Partum Day #1 Subjective: no complaints, up ad lib, and tolerating PO; breastfeeding going okay; declines pp IUD and is still thinking about contraception  Objective: Blood pressure 101/70, pulse 85, temperature 98 F (36.7 C), temperature source Oral, resp. rate 18, height 5' (1.524 m), weight 91.2 kg, last menstrual period 04/11/2022, SpO2 100%, unknown if currently breastfeeding.  Physical Exam:  General: cooperative, fatigued, and no distress Lochia: appropriate Uterine Fundus: firm DVT Evaluation: No evidence of DVT seen on physical exam.  Recent Labs    01/09/23 0152 01/10/23 0609  HGB 12.7 12.7  HCT 39.7 40.6    Assessment/Plan: Plan for discharge tomorrow; will get fasting CBG in the morning.   LOS: 1 day   Julie Michael, CNM 01/10/2023, 11:06 AM

## 2023-01-10 NOTE — Anesthesia Postprocedure Evaluation (Signed)
Anesthesia Post Note  Patient: Emari Gatz  Procedure(s) Performed: AN AD HOC LABOR EPIDURAL     Patient location during evaluation: Mother Baby Anesthesia Type: Epidural Level of consciousness: awake and alert and oriented Pain management: satisfactory to patient Vital Signs Assessment: post-procedure vital signs reviewed and stable Respiratory status: respiratory function stable Cardiovascular status: stable Postop Assessment: no headache, no backache, epidural receding, patient able to bend at knees, no signs of nausea or vomiting, adequate PO intake and able to ambulate Anesthetic complications: no   No notable events documented.  Last Vitals:  Vitals:   01/10/23 0315 01/10/23 0619  BP: (!) 110/58 101/70  Pulse: 87 85  Resp: 18 18  Temp: 36.6 C 36.7 C  SpO2: 98% 100%    Last Pain:  Vitals:   01/10/23 0619  TempSrc: Oral  PainSc: 4    Pain Goal:                Epidural/Spinal Function Cutaneous sensation: Normal sensation (01/10/23 1002)  Tiawana Forgy

## 2023-01-11 ENCOUNTER — Encounter: Payer: Self-pay | Admitting: Family Medicine

## 2023-01-11 LAB — GLUCOSE, CAPILLARY: Glucose-Capillary: 99 mg/dL (ref 70–99)

## 2023-01-11 MED ORDER — SENNOSIDES-DOCUSATE SODIUM 8.6-50 MG PO TABS: 2.0000 | ORAL_TABLET | Freq: Every day | ORAL | Status: DC

## 2023-01-11 MED ORDER — WITCH HAZEL-GLYCERIN EX PADS: 1.0000 | MEDICATED_PAD | CUTANEOUS | 12 refills | Status: DC | PRN

## 2023-01-11 MED ORDER — SIMETHICONE 80 MG PO CHEW: 80.0000 mg | CHEWABLE_TABLET | ORAL | 0 refills | Status: DC | PRN

## 2023-01-11 MED ORDER — COCONUT OIL OIL: 1.0000 | TOPICAL_OIL | Status: DC | PRN

## 2023-01-11 MED ORDER — IBUPROFEN 600 MG PO TABS: 600.0000 mg | ORAL_TABLET | Freq: Four times a day (QID) | ORAL | 0 refills | Status: AC

## 2023-01-11 MED ORDER — ACETAMINOPHEN 325 MG PO TABS: 650.0000 mg | ORAL_TABLET | ORAL | Status: AC | PRN

## 2023-01-11 NOTE — Progress Notes (Signed)
CSW received consult due to score of 8 on Edinburgh Depression Screen and indicated thoughts of self-harm. CSW met MOB at bedside to complete a mental health assessment and offer support. CSW entered the room, introduced herself and explained the reason for the visit. MOB presented bonding/holding the infant and was polite, easy to engage, receptive to meeting with CSW, and appeared forthcoming.  CSW asked MOB about the thoughts surrounding self harm. MOB denied having thoughts surrounding self harm and reported that it was indicated by a mistake. CSW inquired about MOB's mental health history. MOB reported feeling the first time mom jitters and experiencing some anxious moments during pregnancy and denied experiencing any mental health prior to her pregnancy. MOB reported feeling "good" and excited about the new journey of motherhood. CSW provided education regarding Baby Blues vs PMADs and provided MOB with resources for mental health follow up.  CSW encouraged MOB to evaluate her mental health throughout the postpartum period with the use of the New Mom Checklist developed by Postpartum Progress as well as the [place] Postnatal Depression Scale and notify a medical professional if symptoms arise.  CSW assessed for safety with MOB SI/HI/DV;MOB denied all.   CSW asked MOB has she selected a pediatrician for the infant's follow up visits; MOB said they had not chosen a location however they would choose a location prior to discharge. MOB reported having all essential items for the infant including a carseat, bassinet and crib for safe sleeping. CSW provided review of Sudden Infant Death Syndrome (SIDS) precautions.  CSW identifies no further need for intervention and no barriers to discharge at this time.  Enos Fling, Theresia Majors Clinical Social Worker [phone]

## 2023-01-11 NOTE — Lactation Note (Signed)
This note was copied from a baby's chart. Lactation Consultation Note  Patient Name: Julie Michael [id number] Date: 01/11/2023 Age:27 hours, P1, LC 2nd visit S/P anterior Frenotomy  Reason for consult: Follow-up assessment;Primapara;1st time breastfeeding;Term;Infant weight loss;Breastfeeding assistance LC was called the see mom post frenotomy,  LC placed baby STS  on the right breast , football, and once baby woke up well , LC worked with baby to open her mouth and latched with increased depth , swallows, and stayed latched tor 10 mins . After BF LC reviewed and showed mom how to pace feed the baby and not to allow her to nibble onto the nipple.  Baby was able to flanger her lips well and took 29 ml.  LC provided a written LC plan; Steps for latching,  Football position until the baby is latching consistently,  Feed the 1st breast 15 -20 mins , 30 mins max and supplement 25-30 ml and post pump both breast 15 -20 mins until the milk comes in and then give the baby more time and the breast and decrease supplementing if hearing more swallows at the breast and baby is satisfied. Also if weight loss WNL.  LC reviewed the [facility] plan and parents receptive to go to the [facility] O/P and are aware they will receive a call.    Maternal Data Has patient been taught Hand Expression?: Yes Does the patient have breastfeeding experience prior to this delivery?: No  Feeding Mother's Current Feeding Choice: Breast Milk and Formula Nipple Type: Nfant Standard Flow (white)  LATCH Score Latch: Grasps breast easily, tongue down, lips flanged, rhythmical sucking.  Audible Swallowing: A few with stimulation  Type of Nipple: Everted at rest and after stimulation  Comfort (Breast/Nipple): Soft / non-tender  Hold (Positioning): Assistance needed to correctly position infant at breast and maintain latch.  LATCH Score: 8   Lactation Tools Discussed/Used Tools: Flanges;Pump Flange Size: 21;24 Breast pump  type: Double-Electric Breast Pump;Manual Pump Education: Setup, frequency, and cleaning;Milk Storage Reason for Pumping: enhance the milk coming in to organize the baby better  Interventions Interventions: Breast feeding basics reviewed;Assisted with latch;Hand express;Skin to skin;Breast massage;Pre-pump if needed;Reverse pressure;Breast compression;Adjust position;Support pillows;Position options;DEBP;Hand pump;Education;Pace feeding;LC Services brochure  Discharge Discharge Education: Engorgement and breast care;Warning signs for feeding baby;Outpatient recommendation;Outpatient Epic message sent;Other (comment) (parents aware they will receive a call from L:C O/P) Pump: Personal;DEBP;Hands Free;Manual  Consult Status Consult Status: Complete Date: 01/11/23 Follow-up type: In-patient    Julie Michael Julie Michael 01/11/2023, 11:33 AM

## 2023-01-11 NOTE — Lactation Note (Signed)
This note was copied from a baby's chart. Lactation Consultation Note  Patient Name: Girl Celica Purinton [id number] Date: 01/11/2023 Age:27 hours Reason for consult: Follow-up assessment;Primapara;1st time breastfeeding;Term;Nipple pain/trauma;Breastfeeding assistance (3 % weight loss) Baby woke up, LC checked the diaper and changed a wet.  Baby still awake, and LC offered to assess a latch, mom receptive.  LC reviewed basics, hand expressing, steps for latching and the importance of firm support. Baby latched for x2 in 15 mins and stayed latched for 10 mins with a few swallows. Baby also noted to be sluggish with feeding and it probably is due to the baby feeding at 7-8 am breast and formula 18 ml.  LC informed Dr. Ezequiel Essex of the stretchy skin tag under the anterior portion of the tongue. LC recommended to increase mobility for a consistent deeper latch, the release should be considered.  Dr. Ezequiel Essex also had the discussion with parents , consent was signed and prior to the next feeding the skin tag will be released by Dr. Ezequiel Essex.  Mom and dad aware to call for [facility] when the  baby comes back from the nursery for reassessment of the feeding at the breast.   Prior to the feeding assessment LC had reviewed the BF D/C teaching and the [facility] resources.    Maternal Data Has patient been taught Hand Expression?: Yes (noted glistening on the nipple) Does the patient have breastfeeding experience prior to this delivery?: No  Feeding Mother's Current Feeding Choice: Breast Milk and Formula Nipple Type: Slow - flow  LATCH Score Latch: Repeated attempts needed to sustain latch, nipple held in mouth throughout feeding, stimulation needed to elicit sucking reflex.  Audible Swallowing: A few with stimulation  Type of Nipple: Everted at rest and after stimulation  Comfort (Breast/Nipple): Soft / non-tender  Hold (Positioning): Assistance needed to correctly position infant at breast and maintain  latch.  LATCH Score: 7   Lactation Tools Discussed/Used Tools: Pump;Flanges Flange Size: 24;21 Breast pump type: Manual;Double-Electric Breast Pump Pump Education: Milk Storage;Setup, frequency, and cleaning  Interventions Interventions: Assisted with latch;Breast feeding basics reviewed;Skin to skin;Breast massage;Hand express;Pre-pump if needed;Reverse pressure;Breast compression;Adjust position;Support pillows;Position options;Hand pump;DEBP;Education;LC Services brochure  Discharge Discharge Education: Engorgement and breast care Pump: Personal;Hands Free;DEBP;Manual  Consult Status Consult Status: Follow-up Date: 01/11/23 Follow-up type: In-patient    [name] Keveon Amsler 01/11/2023, 9:24 AM

## 2023-01-12 ENCOUNTER — Encounter: Payer: Self-pay | Admitting: Family Medicine

## 2023-01-16 ENCOUNTER — Encounter: Payer: BC Managed Care – PPO | Admitting: Obstetrics and Gynecology

## 2023-01-26 ENCOUNTER — Telehealth (HOSPITAL_COMMUNITY): Payer: Self-pay | Admitting: *Deleted

## 2023-01-26 NOTE — Telephone Encounter (Signed)
01/26/2023  Name: Francetta Janik MRN: [id number] DOB: [birth year]  Reason for Call:  Transition of Care Hospital Discharge Call  Contact Status: Patient Contact Status: Complete  Language assistant needed: Interpreter Mode: Interpreter Not Needed        Follow-Up Questions: Do You Have Any Concerns About Your Health As You Heal From Delivery?: No Do You Have Any Concerns About Your Infants Health?: Yes What Concerns Do You Have About Your Baby?: Concerned about low breastmilk supply, has seen an LC "there", but supply has not increased.  Call dropped.  Called patient back, but call went to voice mail.  Left voice mail with contact information for patient to call me to complete discharge follow-up call.  Edinburgh Postnatal Depression Scale:  In the Past 7 Days:    PHQ2-9 Depression Scale:     Discharge Follow-up:    Post-discharge interventions: NA  Salena Saner, RN 01/26/2023 10:39

## 2023-02-08 ENCOUNTER — Ambulatory Visit: Payer: BC Managed Care – PPO | Admitting: Certified Nurse Midwife

## 2023-02-08 ENCOUNTER — Other Ambulatory Visit: Payer: Self-pay

## 2023-02-08 ENCOUNTER — Encounter: Payer: Self-pay | Admitting: Certified Nurse Midwife

## 2023-02-08 DIAGNOSIS — N898 Other specified noninflammatory disorders of vagina: Secondary | ICD-10-CM | POA: Diagnosis not present

## 2023-02-08 DIAGNOSIS — K649 Unspecified hemorrhoids: Secondary | ICD-10-CM

## 2023-02-08 MED ORDER — HYDROCORTISONE 1 % EX OINT: 1.0000 | TOPICAL_OINTMENT | Freq: Two times a day (BID) | CUTANEOUS | 0 refills | Status: DC

## 2023-02-08 NOTE — Progress Notes (Signed)
Post Partum Visit Note  Julie Michael is a 27 y.o. G68P1001 female who presents for a postpartum visit. She is 4 weeks 2 days postpartum following a normal spontaneous vaginal delivery.  I have fully reviewed the prenatal and intrapartum course. The delivery was at 39 gestational weeks.  Anesthesia: epidural. Postpartum course has been good. Baby is doing well. Baby is feeding by bottle - Similac Soy Isomil. Bleeding staining only. Bowel function is abnormal: hemorrhoid. Bladder function is normal. Patient is not sexually active. Contraception method is abstinence. Postpartum depression screening: negative.   Upstream - 02/08/23 1608       Pregnancy Intention Screening   Does the patient want to become pregnant in the next year? No    Does the patient's partner want to become pregnant in the next year? No    Would the patient like to discuss contraceptive options today? No      Contraception Wrap Up   Current Method Abstinence            The pregnancy intention screening data noted above was reviewed. Potential methods of contraception were discussed. The patient elected to proceed with No data recorded.   Edinburgh Postnatal Depression Scale - 02/08/23 1613       Edinburgh Postnatal Depression Scale:  In the Past 7 Days   I have been able to laugh and see the funny side of things. 0    I have looked forward with enjoyment to things. 0    I have blamed myself unnecessarily when things went wrong. 0    I have been anxious or worried for no good reason. 2    I have felt scared or panicky for no good reason. 0    Things have been getting on top of me. 0    I have been so unhappy that I have had difficulty sleeping. 0    I have felt sad or miserable. 0    I have been so unhappy that I have been crying. 0    The thought of harming myself has occurred to me. 0    Edinburgh Postnatal Depression Scale Total 2             Health Maintenance Due  Topic Date Due    COVID-19 Vaccine (3 - 2023-24 season) 11/13/2022    The following portions of the patient's history were reviewed and updated as appropriate: allergies, current medications, past family history, past medical history, past social history, past surgical history, and problem list.  Review of Systems Pertinent items are noted in HPI.  Objective:  LMP 04/11/2022 (Exact Date)   Breastfeeding No    General:  alert, cooperative, and appears stated age   Breasts:  deferred  Lungs: clear to auscultation bilaterally  Heart:  regular rate and rhythm, S1, S2 normal, no murmur, click, rub or gallop  Abdomen: soft, non-tender; bowel sounds normal; no masses,  no organomegaly   Wound N/A  GU exam:  not indicated       Assessment:    1. Postpartum care and examination -Patient stable and feeling well. Unremarkable exam.  -Will return to clinic for speculum exam of cervical lesion.   2. Vaginal itching - Cervicovaginal ancillary only( [place])  3. Hemorrhoids, unspecified hemorrhoid type -Hydrocortisone cream with f/u in 4 weeks   -Prevention and non-pharmacological therapies discussed. Pt education included in AVS.    Plan:   Essential components of care per ACOG recommendations:  1.  Mood and well being: Patient with negative depression screening today. Reviewed local resources for support.  - Patient tobacco use? No.   - hx of drug use? No.    2. Infant care and feeding:  -Patient currently breastmilk feeding? No.  -Social determinants of health (SDOH) reviewed in EPIC. No concerns identified.  3. Sexuality, contraception and birth spacing - Patient does not want a pregnancy in the next year.  Reviewed contraceptive methods based on pt preferences and effectiveness.  Patient desired Abstinence today.   - Discussed birth spacing of 18 months  4. Sleep and fatigue -Encouraged family/partner/community support of 4 hrs of uninterrupted sleep to help with mood and fatigue  5.  Physical Recovery  - Discussed patients delivery and complications. She describes her labor as good. - Patient had a Vaginal, no problems at delivery.  - Patient has urinary incontinence? No. - Patient is safe to resume physical and sexual activity  6.  Health Maintenance - HM due items addressed- Yes- patient will RTC in 4 weeks for speculum exam for cervical lesion seen by previous provider 10/24.   - Last pap smear  Diagnosis  Date Value Ref Range Status  05/24/2022   Final   - Negative for intraepithelial lesion or malignancy (NILM)   -Breast Cancer screening indicated? No.   7. Chronic Disease/Pregnancy Condition follow up: None  - PCP follow up

## 2023-02-08 NOTE — Progress Notes (Deleted)
Post Partum Visit Note  Julie Michael is a 27 y.o. G70P1001 female who presents for a postpartum visit. She is 4 weeks 2 days postpartum following a normal spontaneous vaginal delivery.  I have fully reviewed the prenatal and intrapartum course. The delivery was at 39 gestational weeks.  Anesthesia: epidural. Postpartum course has been ***. Baby is doing well. Baby is feeding by bottle - Similac Soy Isomil . Bleeding staining only. Bowel function is abnormal: hemorrhoid . Bladder function is normal. Patient is not sexually active. Contraception method is abstinence. Postpartum depression screening: negative.   Upstream - 02/08/23 1608       Pregnancy Intention Screening   Does the patient want to become pregnant in the next year? No    Does the patient's partner want to become pregnant in the next year? No    Would the patient like to discuss contraceptive options today? No      Contraception Wrap Up   Current Method Abstinence            The pregnancy intention screening data noted above was reviewed. Potential methods of contraception were discussed. The patient elected to proceed with No data recorded.   Edinburgh Postnatal Depression Scale - 02/08/23 1613       Edinburgh Postnatal Depression Scale:  In the Past 7 Days   I have been able to laugh and see the funny side of things. 0    I have looked forward with enjoyment to things. 0    I have blamed myself unnecessarily when things went wrong. 0    I have been anxious or worried for no good reason. 2    I have felt scared or panicky for no good reason. 0    Things have been getting on top of me. 0    I have been so unhappy that I have had difficulty sleeping. 0    I have felt sad or miserable. 0    I have been so unhappy that I have been crying. 0    The thought of harming myself has occurred to me. 0    Edinburgh Postnatal Depression Scale Total 2             Health Maintenance Due  Topic Date Due    COVID-19 Vaccine (3 - 2023-24 season) 11/13/2022    {Common ambulatory SmartLinks:19316}  Review of Systems {ros; complete:30496}  Objective:  BP 120/78   Pulse 91   Wt 179 lb (81.2 kg)   LMP 04/11/2022 (Exact Date)   Breastfeeding No   BMI 34.96 kg/m    General:  {gen appearance:16600}   Breasts:  {desc; normal/abnormal/not indicated:14647}  Lungs: {lung exam:16931}  Heart:  {heart exam:5510}  Abdomen: {abdomen exam:16834}   Wound {Wound assessment:11097}  GU exam:  {desc; normal/abnormal/not indicated:14647}       Assessment:    1. Postpartum care and examination ***   *** postpartum exam.   Plan:   Essential components of care per ACOG recommendations:  1.  Mood and well being: Patient with {gen negative/positive:315881} depression screening today. Reviewed local resources for support.  - Patient tobacco use? {tobacco use:25506}  - hx of drug use? {yes/no:25505}    2. Infant care and feeding:  -Patient currently breastmilk feeding? {yes/no:25502}  -Social determinants of health (SDOH) reviewed in EPIC. No concerns***The following needs were identified***  3. Sexuality, contraception and birth spacing - Patient {DOES_DOES XBJ:47829} want a pregnancy in the next year.  Desired family  size is {NUMBER 1-10:22536} children.  - Reviewed reproductive life planning. Reviewed contraceptive methods based on pt preferences and effectiveness.  Patient desired {Upstream End Methods:24109} today.   - Discussed birth spacing of 18 months  4. Sleep and fatigue -Encouraged family/partner/community support of 4 hrs of uninterrupted sleep to help with mood and fatigue  5. Physical Recovery  - Discussed patients delivery and complications. She describes her labor as {description:25511} - Patient had a {CHL AMB DELIVERY:[phone]}. Patient had a {laceration:25518} laceration. Perineal healing reviewed. Patient expressed understanding - Patient has urinary incontinence?  {yes/no:25515} - Patient {ACTION; IS/IS WUJ:[id number]} safe to resume physical and sexual activity  6.  Health Maintenance - HM due items addressed {Yes or If no, why not?:20788} - Last pap smear  Diagnosis  Date Value Ref Range Status  05/24/2022   Final   - Negative for intraepithelial lesion or malignancy (NILM)   Pap smear {done:10129} at today's visit.  -Breast Cancer screening indicated? {indicated:25516}  7. Chronic Disease/Pregnancy Condition follow up: {Follow up:25499}  - PCP follow up  Bernerd Limbo, CNM [facility] [employer], [facility]

## 2023-02-09 NOTE — Patient Instructions (Signed)
Midwife Jam's Lactation Bars  (super rich in fiber so great even if not breastfeeding) Preheat oven to 350 degrees and grease a 9x13 pan Combine your fats and sugars: - 1c salted butter - 1/2-3/4c peanut butter - 2 eggs - 2tbsp flaxmeal in 4tbsp water - 1tsp vanilla - 1c white sugar - 1c brown sugar Mix the following dry ingredients in a separate bowl: - 2c flour - 1tsp baking soda - 1tsp salt Slowly add the dry into the wet mix. Once well combined, stir in the following: - 3c rolled oats  - 1/4c hemp hearts - 1c (or more) chocolate chips  Bake for .  Can sub almond butter and add 1tsp cinnamon and raisins or dried cranberries.

## 2023-02-14 LAB — NUSWAB VAGINITIS PLUS (VG+)
Candida albicans, NAA: NEGATIVE
Candida glabrata, NAA: NEGATIVE
Chlamydia trachomatis, NAA: NEGATIVE
Neisseria gonorrhoeae, NAA: NEGATIVE

## 2023-03-02 ENCOUNTER — Ambulatory Visit: Payer: BC Managed Care – PPO | Admitting: Plastic Surgery

## 2023-03-02 VITALS — BP 125/86 | HR 88 | Ht 60.0 in | Wt 178.2 lb

## 2023-03-02 DIAGNOSIS — N62 Hypertrophy of breast: Secondary | ICD-10-CM

## 2023-03-02 NOTE — Progress Notes (Signed)
Julie Michael returns today for discussion of breast reduction.  She was scheduled for breast reduction earlier this year and found out on the morning of surgery that she was pregnant.  She has since delivered her child and is interested in proceeding with a breast reduction.  On examination she has no dominant masses in either breast.  She has no milk production in either breast.  I believe I can remove 700 g per breast.  She has completed physical therapy already with her prior breast reduction approval.  Will met her again for breast reduction.

## 2023-03-10 ENCOUNTER — Telehealth: Payer: Self-pay | Admitting: Family Medicine

## 2023-03-10 ENCOUNTER — Telehealth: Payer: Self-pay

## 2023-03-10 DIAGNOSIS — Z30012 Encounter for prescription of emergency contraception: Secondary | ICD-10-CM

## 2023-03-10 MED ORDER — ELLA 30 MG PO TABS: 1.0000 | ORAL_TABLET | Freq: Once | ORAL | 0 refills | Status: AC

## 2023-03-10 NOTE — Telephone Encounter (Signed)
Patient called into office to request emergency contraception. Reports unprotected intercourse on 03/08/23. This was the first episode of intercourse since birth on 01/09/23. Has not started birth control. Plans for IUD insertion on 03/29/23. Ella sent to preferred pharmacy. Pt requests multiple pills be sent for future use. Explained we do not recommend regular use, so I will need to forward to her provider Harvin Hazel, CNM for review.

## 2023-03-29 ENCOUNTER — Encounter: Payer: Self-pay | Admitting: Certified Nurse Midwife

## 2023-03-29 ENCOUNTER — Ambulatory Visit: Payer: BC Managed Care – PPO | Admitting: Certified Nurse Midwife

## 2023-03-29 ENCOUNTER — Other Ambulatory Visit: Payer: Self-pay

## 2023-03-29 VITALS — BP 124/74 | HR 84 | Wt 176.1 lb

## 2023-03-29 DIAGNOSIS — Z30430 Encounter for insertion of intrauterine contraceptive device: Secondary | ICD-10-CM

## 2023-03-29 DIAGNOSIS — Z1331 Encounter for screening for depression: Secondary | ICD-10-CM | POA: Diagnosis not present

## 2023-03-29 DIAGNOSIS — N888 Other specified noninflammatory disorders of cervix uteri: Secondary | ICD-10-CM

## 2023-03-29 DIAGNOSIS — Z3202 Encounter for pregnancy test, result negative: Secondary | ICD-10-CM

## 2023-03-29 LAB — POCT PREGNANCY, URINE: Preg Test, Ur: NEGATIVE

## 2023-03-29 MED ORDER — ACETAMINOPHEN 500 MG PO TABS
500.0000 mg | ORAL_TABLET | Freq: Once | ORAL | Status: AC
  Administered 2023-03-29: 500 mg via ORAL

## 2023-03-29 MED ORDER — LEVONORGESTREL 20.1 MCG/DAY IU IUD
1.0000 | INTRAUTERINE_SYSTEM | Freq: Once | INTRAUTERINE | Status: AC
  Administered 2023-03-29: 1 via INTRAUTERINE

## 2023-03-30 NOTE — Progress Notes (Signed)
    History:  Ms. Julie Michael is a 28 y.o. G1P1001 who presents to clinic today for evaluation of a possible cervical lesion and IUD placement. No complaints, last pap on 05/24/22, normal.   The following portions of the patient's history were reviewed and updated as appropriate: allergies, current medications, family history, past medical history, social history, past surgical history and problem list.  Review of Systems:  Pertinent items noted in HPI and remainder of comprehensive ROS otherwise negative.   Objective:  Physical Exam BP 124/74   Pulse 84   Wt 176 lb 1.6 oz (79.9 kg)   LMP 03/11/2023 (Exact Date)   Breastfeeding No   BMI 34.39 kg/m  Physical Exam Vitals and nursing note reviewed.  Constitutional:      General: She is not in acute distress.    Appearance: Normal appearance. She is normal weight. She is not ill-appearing.  Eyes:     Pupils: Pupils are equal, round, and reactive to light.  Cardiovascular:     Rate and Rhythm: Normal rate and regular rhythm.  Genitourinary:    General: Normal vulva.     Vagina: Normal.     Cervix: Friability present.     Musculoskeletal:        General: Normal range of motion.  Skin:    General: Skin is warm and dry.     Capillary Refill: Capillary refill takes less than 2 seconds.  Neurological:     Mental Status: She is alert and oriented to person, place, and time.  Psychiatric:        Mood and Affect: Mood normal.        Behavior: Behavior normal.   Labs and Imaging No results found for this or any previous visit (from the past 24 hours).  No results found.  IUD Insertion Procedure Note Patient identified, informed consent performed, consent signed.   Discussed risks of irregular bleeding, cramping, infection, malpositioning or misplacement of the IUD outside the uterus which may require further procedure such as laparoscopy. Also discussed >99% contraception efficacy, increased risk of ectopic pregnancy  with failure of method.   Emphasized that this did not protect against STIs, condoms recommended during all sexual encounters. Time out was performed.  Chaperone present.  Urine pregnancy test negative.  Speculum placed in the vagina.  Cervix visualized.  Cleaned with Betadine x 2.  Grasped anteriorly with a single tooth tenaculum.  Liletta IUD placed per manufacturer's recommendations.  Strings trimmed to 3 cm. Tenaculum was removed, good hemostasis noted.  Patient tolerated procedure well.   Patient was given post-procedure instructions.  She was advised to have backup contraception for one week.  Patient was also asked to check IUD strings periodically and follow up in 4 weeks for IUD check.  Assessment & Plan:  1. Encounter for IUD insertion (Primary) - Pregnancy, urine POC - acetaminophen (TYLENOL) tablet 500 mg - acetaminophen (TYLENOL) tablet 500 mg - levonorgestrel (LILETTA) 20.1 MCG/DAY IUD 1 each  2. Nabothian cyst - Request to assess for cervical lesion per pt at last appointment. On chart review, no lesion ever noted, was suggested to pt by Dr. Leanora Cover on L&D admission (given history of HSV). - No lesion, only a nabothian cyst.  Follow up in 4 weeks for string check  Bernerd Limbo, CNM 03/30/2023 9:51 PM

## 2023-04-26 ENCOUNTER — Ambulatory Visit: Payer: BC Managed Care – PPO | Admitting: Certified Nurse Midwife

## 2023-05-03 ENCOUNTER — Ambulatory Visit: Payer: BC Managed Care – PPO | Admitting: Certified Nurse Midwife

## 2023-05-08 NOTE — Telephone Encounter (Signed)
 Please disregard error.

## 2023-05-09 NOTE — Progress Notes (Signed)
 Pt canceled appt.

## 2023-05-10 ENCOUNTER — Ambulatory Visit: Payer: Self-pay | Admitting: Certified Nurse Midwife

## 2023-05-10 DIAGNOSIS — Z30431 Encounter for routine checking of intrauterine contraceptive device: Secondary | ICD-10-CM

## 2023-05-22 ENCOUNTER — Ambulatory Visit: Admitting: Physician Assistant

## 2023-05-22 ENCOUNTER — Encounter: Payer: Self-pay | Admitting: Family Medicine

## 2023-05-22 VITALS — BP 126/86 | HR 76 | Ht 60.0 in | Wt 177.0 lb

## 2023-05-22 DIAGNOSIS — Z30432 Encounter for removal of intrauterine contraceptive device: Secondary | ICD-10-CM

## 2023-05-22 NOTE — Progress Notes (Signed)
 GYNECOLOGY  VISIT   HPI: Julie Michael is a 28 y.o.   Single female  G1P1001 here for IUD removal d/t displacement.     GYNECOLOGIC HISTORY: Patient's last menstrual period was 05/18/2023. 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    Past Medical History:  Diagnosis Date   Amenorrhea 04/13/2016   BV (bacterial vaginosis) 12/21/2018   Exposure to COVID-19 virus 09/11/2018   Genital lesion, female 12/21/2018   Gestational diabetes    Healthcare maintenance 07/28/2017   Herpes    Morning sickness 06/16/2022   Routine screening for STI (sexually transmitted infection) 09/08/2016   Sinusitis, chronic 08/09/2013   Strain of abdominal muscle 03/30/2018   Vagina, candidiasis 12/21/2018   Vaginal discharge 12/05/2011   Vaginal Pap smear, abnormal    VISUAL ACUITY, DECREASED, LEFT EYE 05/26/2009   Qualifier: Diagnosis of   By: Lorenda Hatchet CMA,, [place]       Yeast infection    Yeast infection 03/30/2018    Past Surgical History:  Procedure Laterality Date   WISDOM TOOTH EXTRACTION      Current Outpatient Medications  Medication Sig Dispense Refill   acetaminophen (TYLENOL) 325 MG tablet Take 2 tablets (650 mg total) by mouth every 4 (four) hours as needed (for pain scale < 4). (Patient not taking: Reported on 05/22/2023)     Prenatal Vit-Fe Fumarate-FA (MULTIVITAMIN-PRENATAL) 27-0.8 MG TABS tablet Take 1 tablet by mouth daily at 12 noon. (Patient not taking: Reported on 03/29/2023) 30 tablet 11   valACYclovir (VALTREX) 500 MG  tablet Take 1 tablet (500 mg total) by mouth 2 (two) times daily. (Patient not taking: Reported on 03/29/2023) 60 tablet 6   No current facility-administered medications for this visit.     ALLERGIES: Patient has no known allergies.  Family History  Problem Relation Age of Onset   Diabetes Mother    Hypertension Mother    Diabetes Other    Hypertension Other    Asthma Neg Hx    Cancer Neg Hx    Heart disease Neg Hx     Social History   Socioeconomic History   Marital status: Single    Spouse name: Not on file   Number of children: Not on file   Years of education: Not on file   Highest education level: Some college, no degree  Occupational History   Not on file  Tobacco Use   Smoking status: Never    Passive exposure: Never   Smokeless tobacco: Never  Vaping Use   Vaping status: Never Used  Substance and Sexual Activity   Alcohol use: Not Currently    Comment: occas   Drug use: No   Sexual activity: Yes  Birth control/protection: None  Other Topics Concern   Not on file  Social History Narrative   Not on file   Social Drivers of Health   Financial Resource Strain: Low Risk  (10/25/2022)   Overall Financial Resource Strain (CARDIA)    Difficulty of Paying Living Expenses: Not very hard  Food Insecurity: Food Insecurity Present (03/29/2023)   Hunger Vital Sign    Worried About Running Out of Food in the Last Year: Sometimes true    Ran Out of Food in the Last Year: Sometimes true  Transportation Needs: No Transportation Needs (03/29/2023)   PRAPARE - Administrator, Civil Service (Medical): No    Lack of Transportation (Non-Medical): No  Physical Activity: Insufficiently Active (10/25/2022)   Exercise Vital Sign    Days of Exercise per Week: 4 days    Minutes of Exercise per Session: 30 min  Stress: No Stress Concern Present (10/25/2022)   [employer] of Occupational Health - Occupational Stress Questionnaire    Feeling of Stress : Only a  little  Social Connections: Moderately Isolated (10/25/2022)   Social Connection and Isolation Panel [NHANES]    Frequency of Communication with Friends and Family: More than three times a week    Frequency of Social Gatherings with Friends and Family: Twice a week    Attends Religious Services: More than 4 times per year    Active Member of [employer] or Organizations: No    Attends Engineer, structural: Not on file    Marital Status: Never married  Intimate Partner Violence: Not At Risk (01/09/2023)   Humiliation, Afraid, Rape, and Kick questionnaire    Fear of Current or Ex-Partner: No    Emotionally Abused: No    Physically Abused: No    Sexually Abused: No    Review of Systems  PHYSICAL EXAMINATION:    BP 126/86 (BP Location: Right Arm, Patient Position: Sitting, Cuff Size: Normal)   Pulse 76   Ht 5' (1.524 m)   Wt 177 lb (80.3 kg)   LMP 05/18/2023   SpO2 100%   Breastfeeding No   BMI 34.57 kg/m     General appearance: alert, cooperative and appears stated age Head: Normocephalic, without obvious abnormality, atraumatic Lungs: clear to auscultation bilaterally Breasts: Deferred Heart: regular rate and rhythm Extremities: extremities normal, atraumatic, no cyanosis or edema Skin: Skin color, texture, turgor normal. No rashes or lesions Lymph nodes: Cervical, supraclavicular, and axillary nodes normal. No abnormal inguinal nodes palpated Neurologic: Grossly normal  Pelvic: External genitalia:  +IUD strings visible, scant blood present. No lesions present.               Urethra:  normal appearing urethra with no masses, tenderness or lesions              Bartholins and Skenes: normal                 Vagina: normal appearing vagina with normal color and discharge, no lesions              Cervix: no lesions. Scant blood present.   Chaperone was present for exam  IUD Removal  Patient identified, informed consent performed, consent signed.  Patient was in the dorsal  lithotomy position, normal external genitalia was noted.  A speculum was placed in the patient's vagina, normal discharge was noted, no lesions. The cervix was visualized, no lesions, no abnormal discharge.  The strings of the IUD were grasped and pulled using  ring forceps. The IUD was removed in its entirety.   Patient tolerated the procedure well.    Routine preventative health maintenance measures emphasized.  Clement Sayres, PA-C  ASSESSMENT & PLAN   1. Encounter for IUD removal (Primary) Patient with concern for vaginal pain secondary to IUD displacement. Strings visible at external genitalia. No ssxs systemic infection. Patient unsure what she would like for contraception, likely another IUD. Discussed options; reference also given via AVS.   An After Visit Summary was printed and given to the patient.  Ralene Muskrat, [place] 3/10/202510:57 AM

## 2023-05-22 NOTE — Patient Instructions (Signed)
 To compare birth control options, go to: [email or web address]

## 2023-05-24 ENCOUNTER — Ambulatory Visit: Payer: Self-pay | Admitting: Certified Nurse Midwife

## 2024-02-23 ENCOUNTER — Encounter: Admitting: Family Medicine

## 2024-04-01 ENCOUNTER — Other Ambulatory Visit (HOSPITAL_COMMUNITY)
Admission: RE | Admit: 2024-04-01 | Discharge: 2024-04-01 | Disposition: A | Discharge status: Home / Self Care | Source: Ambulatory Visit | Attending: Family Medicine | Admitting: Family Medicine

## 2024-04-01 ENCOUNTER — Encounter: Payer: Self-pay | Admitting: Student

## 2024-04-01 ENCOUNTER — Ambulatory Visit (INDEPENDENT_AMBULATORY_CARE_PROVIDER_SITE_OTHER): Payer: Self-pay | Admitting: Student

## 2024-04-01 ENCOUNTER — Ambulatory Visit: Payer: Self-pay | Admitting: Student

## 2024-04-01 VITALS — BP 110/74 | HR 82 | Ht 60.0 in | Wt 181.4 lb

## 2024-04-01 DIAGNOSIS — Z1159 Encounter for screening for other viral diseases: Secondary | ICD-10-CM

## 2024-04-01 DIAGNOSIS — B3731 Acute candidiasis of vulva and vagina: Secondary | ICD-10-CM

## 2024-04-01 DIAGNOSIS — Z113 Encounter for screening for infections with a predominantly sexual mode of transmission: Secondary | ICD-10-CM | POA: Diagnosis not present

## 2024-04-01 DIAGNOSIS — Z131 Encounter for screening for diabetes mellitus: Secondary | ICD-10-CM

## 2024-04-01 DIAGNOSIS — Z0000 Encounter for general adult medical examination without abnormal findings: Secondary | ICD-10-CM | POA: Diagnosis not present

## 2024-04-01 DIAGNOSIS — B9689 Other specified bacterial agents as the cause of diseases classified elsewhere: Secondary | ICD-10-CM

## 2024-04-01 DIAGNOSIS — Z13220 Encounter for screening for lipoid disorders: Secondary | ICD-10-CM | POA: Diagnosis not present

## 2024-04-01 LAB — POCT GLYCOSYLATED HEMOGLOBIN (HGB A1C): Hemoglobin A1C: 5.4 % (ref 4.0–5.6)

## 2024-04-01 NOTE — Progress Notes (Signed)
" ° ° °  SUBJECTIVE:   Chief compliant/HPI: annual examination  Julie Michael is a 29 y.o. who presents today for an annual exam.   Updated history tabs and problem list .   Patient like to schedule appointment to discuss weight loss medications.  She has not as primary coverage.  Discussed GLP-1 may or may not be covered, recommend a follow-up appointment dedicated to weight loss to document history/diet/exercise and to go over medication options for weight loss.  This has been scheduled for February 13, which is patient's day off with Dr. Zheng.  OBJECTIVE:   BP 110/74   Pulse 82   Ht 5' (1.524 m)   Wt 181 lb 6 oz (82.3 kg)   LMP 03/13/2024   SpO2 97%   BMI 35.42 kg/m    General: NAD, pleasant, Cardio: RRR, no MRG. Respiratory: CTAB, normal wob on RA GI: Abdomen is soft, not tender, not distended. BS present Skin: Warm and dry  ASSESSMENT/PLAN:   Assessment & Plan Annual physical exam See AVS for age appropriate recommendations.   PHQ score 2, reviewed and discussed. Blood pressure reviewed and at goal  The patient currently uses nothing for contraception. Folate recommended as appropriate, minimum of 400 mcg per day.   Considered the following items based upon USPSTF recommendations: HIV testing:ordered Hepatitis C: recently completed and result reviewed, normal  Hepatitis B: Vaccine history Syphilis if at high risk: ordered GC/CT ordered Lipid panel (nonfasting or fasting) discussed based upon AHA recommendations and ordered.  Consider repeat every 4-6 years.  Cervical cancer screening: prior Pap reviewed, repeat due in March 2027 MyChart Activation:Already signed up  Follow up in 1  year or sooner if indicated.   Julie Church, DO [facility]  "

## 2024-04-01 NOTE — Patient Instructions (Addendum)
 It was great to see you! Thank you for allowing me to participate in your care!   I recommend that you always bring your medications to each appointment as this makes it easy to ensure we are on the correct medications and helps us  not miss when refills are needed.  Our plans for today:  - we are checking some labs, I will notify you in Mychart of the results. I will call if urgent. - Your department for weight loss counseling with Dr. Elicia on February 13 - Recommend looking into Nexplanon   Take care and seek immediate care sooner if you develop any concerns. Please remember to show up 15 minutes before your scheduled appointment time!  Gladis Church, DO [facility]

## 2024-04-01 NOTE — Addendum Note (Signed)
 Addended by: Emmalin Jaquess L on: 04/01/2024 03:13 PM   Modules accepted: Orders

## 2024-04-02 LAB — CERVICOVAGINAL ANCILLARY ONLY
Bacterial Vaginitis (gardnerella): POSITIVE — AB
Candida Glabrata: NEGATIVE
Candida Vaginitis: POSITIVE — AB
Chlamydia: NEGATIVE
Comment: NEGATIVE
Comment: NEGATIVE
Comment: NEGATIVE
Comment: NEGATIVE
Comment: NEGATIVE
Comment: NORMAL
Neisseria Gonorrhea: NEGATIVE
Trichomonas: NEGATIVE

## 2024-04-02 LAB — BASIC METABOLIC PANEL WITH GFR
BUN/Creatinine Ratio: 14 (ref 9–23)
BUN: 11 mg/dL (ref 6–20)
CO2: 21 mmol/L (ref 20–29)
Calcium: 9.4 mg/dL (ref 8.7–10.2)
Chloride: 105 mmol/L (ref 96–106)
Creatinine, Ser: 0.79 mg/dL (ref 0.57–1.00)
Glucose: 70 mg/dL (ref 70–99)
Potassium: 4.3 mmol/L (ref 3.5–5.2)
Sodium: 139 mmol/L (ref 134–144)
eGFR: 104 mL/min/1.73

## 2024-04-02 LAB — LIPID PANEL
Chol/HDL Ratio: 4.3 ratio (ref 0.0–4.4)
Cholesterol, Total: 180 mg/dL (ref 100–199)
HDL: 42 mg/dL
LDL Chol Calc (NIH): 117 mg/dL — ABNORMAL HIGH (ref 0–99)
Triglycerides: 113 mg/dL (ref 0–149)
VLDL Cholesterol Cal: 21 mg/dL (ref 5–40)

## 2024-04-02 LAB — SYPHILIS: RPR W/REFLEX TO RPR TITER AND TREPONEMAL ANTIBODIES, TRADITIONAL SCREENING AND DIAGNOSIS ALGORITHM: RPR Ser Ql: NONREACTIVE

## 2024-04-02 LAB — HIV ANTIBODY (ROUTINE TESTING W REFLEX): HIV Screen 4th Generation wRfx: NONREACTIVE

## 2024-04-03 ENCOUNTER — Telehealth: Payer: Self-pay

## 2024-04-03 MED ORDER — FLUCONAZOLE 150 MG PO TABS: 150.0000 mg | ORAL_TABLET | Freq: Once | ORAL | 0 refills | Status: AC

## 2024-04-03 MED ORDER — METRONIDAZOLE 500 MG PO TABS: 500.0000 mg | ORAL_TABLET | Freq: Three times a day (TID) | ORAL | 0 refills | Status: AC

## 2024-04-03 NOTE — Telephone Encounter (Signed)
 Patient calls nurse line in regards to vaginal swab.   She reports she viewed her results on mychart.   She reports vaginal discharge with an odor. She reports irritation was well.   She would like to be treated for both yeast and BV.   Will forward to provider who saw patient for concern.   *if able to send in today, please send to the [facility] on [address].*

## 2024-04-17 ENCOUNTER — Ambulatory Visit: Admitting: Plastic Surgery

## 2024-04-17 VITALS — BP 128/85 | HR 70 | Ht 60.0 in | Wt 182.0 lb

## 2024-04-17 DIAGNOSIS — N62 Hypertrophy of breast: Secondary | ICD-10-CM

## 2024-04-17 DIAGNOSIS — M546 Pain in thoracic spine: Secondary | ICD-10-CM

## 2024-04-17 DIAGNOSIS — M542 Cervicalgia: Secondary | ICD-10-CM | POA: Diagnosis not present

## 2024-04-18 NOTE — Progress Notes (Signed)
 Ms. Ogan returns today for evaluation.  She was previously scheduled for a bilateral breast reduction in March 2024.  Surgery was canceled secondary to a positive pregnancy test.  Patient is now past her postpartum period and would like to proceed with the reduction.  She has had no change in her upper back and neck pain nor improvement in the discomfort from her bras.  On examination she has no dominant masses breast exam.  Breast remain large and pendulous.  Her sternal notch to nipple distance is 38 on the right and 37 on the left the nipple to fold distance is 18 cm on the right and 18 cm on the left  I believe that I can remove 800 g per breast.  Macromastia: Patient has large breasts and would likely benefit from bilateral breast reduction.  We did discuss breast reductions previously but we reviewed the risks which include bleeding infection and seroma.  She understands I will use drains postoperatively.  We discussed the risk of nipple loss due to nipple ischemia.  We discussed the need for supportive compressive garment for 6 weeks.  We discussed the postoperative limitations including no heavy lifting greater than 20 pounds, no vigorous activity, no submerging the incisions in water for 6 weeks.  She understands that it may not be possible to breast-feed after breast reduction.  All questions were answered to her satisfaction.  New photographs were obtained today with her consent.  Will resubmit her for authorization for a breast reduction at her request.  I spent 30 minutes reviewing the chart examining the patient documenting and coordinating care.

## 2024-04-26 ENCOUNTER — Ambulatory Visit: Payer: Self-pay | Admitting: Family Medicine

## 2024-05-15 ENCOUNTER — Ambulatory Visit: Payer: Self-pay | Admitting: Certified Nurse Midwife
